# Patient Record
Sex: Female | Born: 1955 | Race: Black or African American | Hispanic: No | Marital: Married | Smoking: Never smoker
Health system: Southern US, Community
[De-identification: ages and names within clinical notes are randomized; demographics above are authoritative.]

## PROBLEM LIST (undated history)

## (undated) DIAGNOSIS — D219 Benign neoplasm of connective and other soft tissue, unspecified: Secondary | ICD-10-CM

## (undated) HISTORY — DX: Benign neoplasm of connective and other soft tissue, unspecified: D21.9

---

## 1987-01-21 HISTORY — PX: TUBAL LIGATION: SHX77

## 1999-08-07 ENCOUNTER — Other Ambulatory Visit: Admission: RE | Admit: 1999-08-07 | Discharge: 1999-08-07 | Payer: Self-pay | Admitting: Family Medicine

## 1999-08-27 ENCOUNTER — Other Ambulatory Visit: Admission: RE | Admit: 1999-08-27 | Discharge: 1999-08-27 | Payer: Self-pay | Admitting: Family Medicine

## 2000-09-23 ENCOUNTER — Other Ambulatory Visit: Admission: RE | Admit: 2000-09-23 | Discharge: 2000-09-23 | Payer: Self-pay | Admitting: *Deleted

## 2001-02-17 ENCOUNTER — Encounter: Payer: Self-pay | Admitting: Family Medicine

## 2001-02-17 ENCOUNTER — Ambulatory Visit (HOSPITAL_COMMUNITY): Admission: RE | Admit: 2001-02-17 | Discharge: 2001-02-17 | Payer: Self-pay | Admitting: Family Medicine

## 2001-03-25 ENCOUNTER — Ambulatory Visit (HOSPITAL_COMMUNITY): Admission: RE | Admit: 2001-03-25 | Discharge: 2001-03-25 | Payer: Self-pay | Admitting: *Deleted

## 2001-03-25 ENCOUNTER — Encounter (INDEPENDENT_AMBULATORY_CARE_PROVIDER_SITE_OTHER): Payer: Self-pay | Admitting: *Deleted

## 2001-07-09 ENCOUNTER — Encounter: Admission: RE | Admit: 2001-07-09 | Discharge: 2001-07-09 | Payer: Self-pay | Admitting: *Deleted

## 2001-07-09 ENCOUNTER — Encounter: Payer: Self-pay | Admitting: *Deleted

## 2002-07-20 ENCOUNTER — Encounter: Admission: RE | Admit: 2002-07-20 | Discharge: 2002-07-20 | Payer: Self-pay

## 2003-02-14 ENCOUNTER — Encounter: Admission: RE | Admit: 2003-02-14 | Discharge: 2003-02-14 | Payer: Self-pay | Admitting: Family Medicine

## 2003-02-24 ENCOUNTER — Ambulatory Visit (HOSPITAL_COMMUNITY): Admission: RE | Admit: 2003-02-24 | Discharge: 2003-02-24 | Payer: Self-pay | Admitting: Family Medicine

## 2003-04-04 ENCOUNTER — Other Ambulatory Visit: Admission: RE | Admit: 2003-04-04 | Discharge: 2003-04-04 | Payer: Self-pay | Admitting: Family Medicine

## 2003-07-21 ENCOUNTER — Encounter: Admission: RE | Admit: 2003-07-21 | Discharge: 2003-07-21 | Payer: Self-pay

## 2003-07-27 ENCOUNTER — Encounter: Admission: RE | Admit: 2003-07-27 | Discharge: 2003-07-27 | Payer: Self-pay

## 2004-04-15 ENCOUNTER — Other Ambulatory Visit: Admission: RE | Admit: 2004-04-15 | Discharge: 2004-04-15 | Payer: Self-pay | Admitting: Gynecology

## 2004-08-08 ENCOUNTER — Encounter: Admission: RE | Admit: 2004-08-08 | Discharge: 2004-08-08 | Payer: Self-pay | Admitting: Gynecology

## 2004-10-16 ENCOUNTER — Other Ambulatory Visit: Admission: RE | Admit: 2004-10-16 | Discharge: 2004-10-16 | Payer: Self-pay | Admitting: Gynecology

## 2005-07-30 ENCOUNTER — Other Ambulatory Visit: Admission: RE | Admit: 2005-07-30 | Discharge: 2005-07-30 | Payer: Self-pay | Admitting: Gynecology

## 2005-08-13 ENCOUNTER — Encounter: Admission: RE | Admit: 2005-08-13 | Discharge: 2005-08-13 | Payer: Self-pay | Admitting: Gynecology

## 2005-10-31 ENCOUNTER — Ambulatory Visit (HOSPITAL_BASED_OUTPATIENT_CLINIC_OR_DEPARTMENT_OTHER): Admission: RE | Admit: 2005-10-31 | Discharge: 2005-10-31 | Payer: Self-pay | Admitting: Orthopedic Surgery

## 2006-08-18 ENCOUNTER — Encounter: Admission: RE | Admit: 2006-08-18 | Discharge: 2006-08-18 | Payer: Self-pay | Admitting: Gynecology

## 2006-10-09 ENCOUNTER — Other Ambulatory Visit: Admission: RE | Admit: 2006-10-09 | Discharge: 2006-10-09 | Payer: Self-pay | Admitting: Gynecology

## 2006-10-19 ENCOUNTER — Inpatient Hospital Stay (HOSPITAL_COMMUNITY): Admission: RE | Admit: 2006-10-19 | Discharge: 2006-10-21 | Payer: Self-pay | Admitting: Gynecology

## 2006-10-19 ENCOUNTER — Encounter: Payer: Self-pay | Admitting: Gynecology

## 2006-10-19 HISTORY — PX: ABDOMINAL HYSTERECTOMY: SHX81

## 2007-08-23 ENCOUNTER — Encounter: Admission: RE | Admit: 2007-08-23 | Discharge: 2007-08-23 | Payer: Self-pay | Admitting: Gynecology

## 2007-10-13 ENCOUNTER — Ambulatory Visit: Payer: Self-pay | Admitting: Gynecology

## 2007-10-13 ENCOUNTER — Other Ambulatory Visit: Admission: RE | Admit: 2007-10-13 | Discharge: 2007-10-13 | Payer: Self-pay | Admitting: Gynecology

## 2007-10-13 ENCOUNTER — Encounter: Payer: Self-pay | Admitting: Gynecology

## 2007-10-20 ENCOUNTER — Ambulatory Visit: Payer: Self-pay | Admitting: Gynecology

## 2007-11-01 ENCOUNTER — Ambulatory Visit: Payer: Self-pay | Admitting: Gynecology

## 2008-08-23 ENCOUNTER — Encounter: Admission: RE | Admit: 2008-08-23 | Discharge: 2008-08-23 | Payer: Self-pay | Admitting: Gynecology

## 2008-10-17 ENCOUNTER — Encounter: Payer: Self-pay | Admitting: Gynecology

## 2008-10-17 ENCOUNTER — Other Ambulatory Visit: Admission: RE | Admit: 2008-10-17 | Discharge: 2008-10-17 | Payer: Self-pay | Admitting: Gynecology

## 2008-10-17 ENCOUNTER — Ambulatory Visit: Payer: Self-pay | Admitting: Gynecology

## 2008-10-20 ENCOUNTER — Ambulatory Visit: Payer: Self-pay | Admitting: Gynecology

## 2008-10-27 ENCOUNTER — Ambulatory Visit: Payer: Self-pay | Admitting: Gynecology

## 2009-04-24 ENCOUNTER — Ambulatory Visit: Payer: Self-pay | Admitting: Gynecology

## 2009-06-28 ENCOUNTER — Encounter: Admission: RE | Admit: 2009-06-28 | Discharge: 2009-06-28 | Payer: Self-pay | Admitting: Family Medicine

## 2009-08-27 ENCOUNTER — Encounter: Admission: RE | Admit: 2009-08-27 | Discharge: 2009-08-27 | Payer: Self-pay | Admitting: Gynecology

## 2009-08-30 ENCOUNTER — Encounter: Admission: RE | Admit: 2009-08-30 | Discharge: 2009-08-30 | Payer: Self-pay | Admitting: Gynecology

## 2009-10-18 ENCOUNTER — Other Ambulatory Visit: Admission: RE | Admit: 2009-10-18 | Discharge: 2009-10-18 | Payer: Self-pay | Admitting: Gynecology

## 2009-10-18 ENCOUNTER — Ambulatory Visit: Payer: Self-pay | Admitting: Gynecology

## 2009-12-20 ENCOUNTER — Ambulatory Visit: Payer: Self-pay | Admitting: Gynecology

## 2010-06-04 NOTE — H&P (Signed)
NAME:  Courtney Baxter, Courtney Baxter             ACCOUNT NO.:  [id number]   MEDICAL RECORD NO.:  [id number]          PATIENT TYPE:  AMB   LOCATION:  SDC                           FACILITY:  [facility]   PHYSICIAN:  Juan H. Lily Peer, M.D.DATE OF BIRTH:  Apr 08, 1955   DATE OF ADMISSION:  10/15/2006  DATE OF DISCHARGE:                              HISTORY & PHYSICAL   CHIEF COMPLAINT:  Symptomatic leiomyomata uteri.   HISTORY:  The patient is a 55 year old, gravida 2, para 2 who was seen  in the office for her annual gynecological examination as well as  preoperative consultation on September 19. The patient is scheduled to  undergo a total abdominal hysterectomy and bilateral salpingo-  oophorectomy on Monday, October 19, 2006 at 7:30 a.m. at [facility] secondary to symptomatic to leiomyomatous uteri and  dysfunctional uterine bleeding. We had attempted in the past to do  endometrial biopsy but due to the encroachment from these multiple  fibroids, it made it difficult to be able to pass the endometrial  catheter. Her ultrasound on May 29 demonstrated she had multiple  fibroids, one measuring 5.6 x 4.3 cm posterior to the uterus, another  one measured 4.7 x 4.5 cm and a third one to the right of the uterus  measuring 5.6 x 5.8 cm. Overall her urine size is 11.4 x 10 x 10.4 cm.  Endometrial stripe was 11 mm. The right ovary was not seen on the  ultrasound, the left ovary was normal. The patient also that day at her  preoperative consultation she was having a vaginal discharge. A wet prep  had been done which demonstrated evidence of bacterial vaginosis and  moniliasis. She was placed on Flagyl 500 mg b.i.d. for a 5-day course as  well as given a prescription for Diflucan 150 mg p.o.   PAST MEDICAL HISTORY:  She denies any allergies, she has had previous  laparoscopy, tubal ligation in 1982, two normal spontaneous vaginal  deliveries. She is currently on no medication. No past medical history.  No  prior other surgeries.   FAMILY HISTORY:  Negative family history.   PHYSICAL EXAMINATION:  The patient weighs 154 pounds, height 5 feet 5  inches tall.  HEENT:  Unremarkable.  NECK:  Supple, trachea midline. No carotid bruits, no thyromegaly.  LUNGS:  Clear to auscultation without rhonchi or wheezes.  HEART:  Regular rate and rhythm. No murmurs or gallops.  BREASTS:  Unremarkable.  ABDOMEN:  Soft, nontender, no rebound, or guarding.  PELVIC:  Bartholin, urethra and Skene's gland within normal limits.  Vagina and cervix no gross lesions on inspection. Bimanual examination  demonstrated an irregular uterus measuring 12-14 weeks size. It makes it  difficult to evaluate secondary to the size of the uterus.  RECTAL:  Deferred.   ASSESSMENT:  A 55 year old, gravida 2, para 2 with dysfunctional uterine  bleeding and symptomatic leiomyomatous uteri scheduled for total  abdominal hysterectomy and bilateral salpingo-oophorectomy Monday,  September 29 at 7:30 a.m. at [facility]. The risks, benefits and  pros and cons of the operation were discussed with the patient to  include infection  although she will receive prophylaxis antibiotics, the  risks for deep venous thrombosis and subsequent pulmonary embolism. For  this reason, PSA stockings will be placed. Also in the event of  uncontrollable hemorrhage if she were to receive blood or blood products  she is fully aware of the potential risk for anaphylactic reaction,  hepatitis and AIDS. Finally the potential risk for trauma to internal  organs such as the bladder, intestines and internal structures requiring  corrective surgery. The patient is also fully aware that when she has a  bilateral hysterectomy and bilateral oophorectomy she may need to be  placed on hormone replacement therapy for a short time to help with the  vasomotor symptoms. Women's Health Initiative study had been discussed  as well. All questions are answered and will  follow accordingly. Of  note, her preop lab demonstrated a urinalysis of 25,000 pure colony  count and we will call in a prescription for Cipro 250 mg b.i.d. to take  for 3 days.      Juan H. Lily Peer, M.D.  Electronically Signed     JHF/MEDQ  D:  10/15/2006  T:  10/15/2006  Job:  [id number]

## 2010-06-04 NOTE — Op Note (Signed)
NAME:  Courtney Baxter, Courtney Baxter             ACCOUNT NO.:  [id number]   MEDICAL RECORD NO.:  [id number]          PATIENT TYPE:  AMB   LOCATION:  SDC                           FACILITY:  [facility]   PHYSICIAN:  Juan H. Lily Peer, M.D.DATE OF BIRTH:  1955/07/15   DATE OF PROCEDURE:  10/19/2006  DATE OF DISCHARGE:                               OPERATIVE REPORT   SURGEON:  Juan H. Lily Peer, M.D.   FIRST ASSISTANT:  Daniel L. Eda Paschal, M.D.   INDICATIONS FOR OPERATION:  A 55 year old gravida 2, para 2 with  symptomatic leiomyomatous uteri complaining of dysmenorrhea,  menorrhagia, and dysfunctional uterine bleeding.   PREOPERATIVE DIAGNOSES:  1. Leiomyomatous uteri.  2. Pelvic pain.  3. Dysfunctional uterine bleeding.   POSTOPERATIVE DIAGNOSES:  1. Leiomyomatous uteri.  2. Pelvic pain.  3. Dysfunctional uterine bleeding.   ANESTHESIA:  General endotracheal anesthesia.   PROCEDURE PERFORMED:  Total abdominal hysterectomy with bilateral  salpingo-oophorectomy.   FINDINGS:  The patient with a 12-week size leiomyomatous uteri with  multiple subserosal or intramural leiomyomas, normal-appearing atretic  ovaries, and evidence of previous tubal ligation and pelvic adhesions.   DESCRIPTION OF OPERATION:  After the patient was adequately counseled,  she was taken to the operating room where she underwent successful  general endotracheal anesthesia.  She received a gram of cefoxitin for  prophylaxis.  A Foley catheter had been inserted for monitorization of  urine output and PAS stockings were utilized for DVT prophylaxis.  Once  they were following this, the abdomen was prepped and draped in the  usual sterile fashion.  A Pfannenstiel skin incision was made 2 cm above  the symphysis pubis.  Incision was carried out through skin,  subcutaneous tissue down to the rectus fascia where a midline nick was  made.  The fascia was incised in transverse fashion.  The peritoneal  cavity was entered  cautiously.  The patient was then placed in  Trendelenburg position.  The O'Connor-O'Sullivan retractors were placed.  At this point, it was noted she had multiple leiomyomas encroaching into  the cul-de-sac and at the first area that was identified with the right  round ligament, which was suture ligated with 0 Vicryl suture and  transected and anterior bladder flap was established.  The right ureter  was identified.  The surgeon's finger penetrated posterior broad  ligament.  The right infundibulopelvic ligament was clamped, cut, free  tied with 0 Vicryl suture followed by transfixion stitch.  Similar  procedure was carried out on the contralateral side.  The parametrial  tissue was dissected and the cardinal and broad ligaments were serially  clipped, serially clamped, cut, and suture ligated with 0 Vicryl suture.  Due to the size of the fibroid uterus and for exposure, enucleation of  several leiomyomas was required in order to have adequate exposure.  Once the uterine arteries had been secured, clamped, cut, and suture  ligated with 0 Vicryl suture, supracervical hysterectomy was performed  by removing the upper portion of the uterus tube and ovary and passed  off the operative field.  The remaining cervical stump was identified.  The bladder  was peeled off the lower uterine segment and the right and  left fornix was entered and with the use of Jorgenson scissors, the  cervix was excised off the vagina.  Both angles were secured with a  transfixion stitch of 0 Vicryl suture and the remaining cuff was secured  with interrupted suture of 0 Vicryl suture.  The pelvic cavity was  copiously irrigated with normal saline solution.  Surgicel was used for  additional hemostasis from some of the raw surfaces.  Sponge count and  needle count were correct after the pelvic cavity had been copiously  irrigated with normal saline solution.  The visceral peritoneum was  closed with a running stitch  of 3-0 Vicryl suture and the fascia was  reapproximated with running stitch of 0 Vicryl suture.   For postoperative pain, an ON-Q catheter was placed between the  peritoneum and the fascia.  The fascia had been secured with 0 Vicryl  suture and the skin was reapproximated with a running subcuticular  stitch of 3-0 Vicryl suture and incision was Steri-Stripped.  A bolus of  1% lidocaine 10 mL was instilled through the ON-Q catheter and dressings  were placed.  The patient was extubated and transferred to recovery room  with stable vital signs.   BLOOD LOSS FROM THE PROCEDURE:  550 mL.   IV FLUIDS:  Consisted of 3300 mL of lactated Ringer.   URINE OUTPUT:  200 mL and clear.   SPECIMEN WEIGHT:  587.4 g.      Juan H. Lily Peer, M.D.  Electronically Signed     JHF/MEDQ  D:  10/19/2006  T:  10/19/2006  Job:  [id number]

## 2010-06-07 NOTE — Op Note (Signed)
NAME:  Courtney Baxter, Courtney Baxter             ACCOUNT NO.:  [id number]   MEDICAL RECORD NO.:  [id number]          PATIENT TYPE:  AMB   LOCATION:  DSC                          FACILITY:  [facility]   PHYSICIAN:  Harvie Junior, M.D.   DATE OF BIRTH:  05-Jan-1956   DATE OF PROCEDURE:  10/31/2005  DATE OF DISCHARGE:                                 OPERATIVE REPORT   PREOPERATIVE DIAGNOSIS:  Rupture of the ulnar collateral ligament to the  thumb.   POSTOPERATIVE DIAGNOSIS:  Rupture of the ulnar collateral ligament to the  thumb.   PRINCIPLE PROCEDURE:  Open repair of ulnar collateral ligament to the right  thumb.   SURGEON:  Harvie Junior, M.D.   ASSISTANT:  Marshia Ly, P.A.   ANESTHESIA:  General.   BRIEF HISTORY:  Courtney Baxter is a 55 year old female with a long history of  having fallen and sort of sprained her thumb.  She was evaluated and noted  at that time to have really complete rupture of the ulnar collateral  ligament.  There was no tendency towards holding the thumb still when you  tried to adduct it, and it was 60 degrees greater than the opposite side.  We talked to her about treatment options, and it was ultimately felt that  with that degree of instability, she would be likely to go on to have  trouble longterm, and we felt that repair of this was appropriate.  She was  brought to the operating room for this procedure.   PROCEDURE:  The patient was brought to the operating room, and after an  adequate anesthesia was obtained with a general anesthetic, the patient was  placed supine on the operating table.  The right arm was then prepped and  draped in the usual sterile fashion.  Following this, a curved incision was  made from just over the ulnar collateral ligament, curving dorsally, and  starting at the midaxial line of the thumb on the ulnar side.  The  subcutaneous tissues were dissected to the level of the adductor  aponeurosis, which was divided and tagged and  identified, which gave access  to the joint capsule, just ulnar to the extensor tendon.  The joint capsule  was opened.  Once this was accomplished, you could see the torn ulnar  collateral ligament.  Interestingly, it was attached fine on the proximal  phalanx, but evulsed from the metacarpal side.  The ligament was then freed  from the overlying capsule, and when that was achieved, the ligament could  be held in place, and this stabilized the thumb quite nicely.  A mini-  Arthrex anchor was then used, and the area of the bone was roughened and the  ligament was sewn right down to the central portion of the metacarpal head.  This gave excellent repair of the ulnar collateral ligament and essentially  eliminated the abduction of the thumb past the normal limit.  At that point,  a capsular repair was undertaken with some 4-0 Tycron interrupted suture.  Once the capsular repair was completed, the adductor aponeurosis was  advanced and repaired in  a running fashion to where it came from.  Once that  was completed, a final check was made of the stability.  Excellent stability  was achieved at this point.  The patient was then splinted in slight  abduction, and after  the skin was closed with nylon interrupted sutures.  Once the splint was  applied, after a sterile compressive dressing was applied, then the patient  was taken to the recovery room, where she was noted to be in satisfactory  condition.  The estimated blood loss for the procedure was none.      Harvie Junior, M.D.  Electronically Signed     JLG/MEDQ  D:  10/31/2005  T:  10/31/2005  Job:  [id number]

## 2010-06-07 NOTE — Discharge Summary (Signed)
NAME:  Courtney Baxter, Courtney Baxter             ACCOUNT NO.:  [id number]   MEDICAL RECORD NO.:  [id number]          PATIENT TYPE:  INP   LOCATION:  9317                          FACILITY:  [facility]   PHYSICIAN:  Juan H. Lily Peer, M.D.DATE OF BIRTH:  [birth year]   DATE OF ADMISSION:  10/19/2006  DATE OF DISCHARGE:  10/21/2006                               DISCHARGE SUMMARY   Total days hospitalized were 2.   HISTORY:  The patient is a 55 year old gravida 2, para 2, who was taken  to the operating room on the morning of October 19, 2006, where she  underwent a transabdominal hysterectomy with bilateral salpingo-  oophorectomy secondary to symptomatic leiomyomatous uteri and  dysfunctional uterine bleeding.  The patient did well intraoperatively.  She had PSA stockings for DVT prophylaxis and received cefoxitin 1 g for  prophylaxis as well and had an EBL of 550 mL during her surgery.  Postoperatively, on postoperative day #1, her maximum temperature was  98.  Her O2 sats were 99% on room air.  Her hemoglobin and hematocrit  were 8.6 and 24.9 respectively with platelet count of 284,000.  Her PCA  pump and Foley catheter were discontinued that day.  She was started on  clear liquid diet.  She was starting to ambulate.  The patient had  orthostatic blood pressures done that day and systolics in the upper  120s to 130s and diastolics in the high 70s and low 80s with stable  pulse between 67 and 80.  The patient was just sluggish getting out of  bed, but once she got up and started ambulating, she was not  symptomatic.  On her second postoperative day, she was ambulating, had  passed flatus, and tolerated a regular diet.  Her incision site was  intact, and she was ready to be discharged home.   PROCEDURES PERFORMED:  Total abdominal hysterectomy and bilateral  salpingo-oophorectomy.   DIAGNOSES:  1. Dysmenorrhea.  2. Menorrhagia.  3. Fibroids.  4. Anemia.   FINAL DISPOSITION AND FOLLOWUP:  The  patient was discharged home on her  second postoperative day.  Her pathology report demonstrated cervix with  mild chronic inflammation, endometrium with secretory endometrium,  myometrium with leiomyoma, serosa unremarkable, right and left ovaries  and tubes were otherwise unremarkable except for paratubal cyst.  The  patient was given a prescription for Lortab 7.5/500 mg to take 1 p.o. q.  4-6 h. p.r.n. pain, Motrin 800 mg t.i.d. p.r.n., Reglan 10 mg 1 p.o. q.  4-6 h. p.r.n. nausea or vomiting.  The patient was started on Estratest  0.65 mg to take 1 p.o. daily, and she is to follow up in the office on  October 27, 2006, and she was instructed to take 1 iron tablet daily.      Juan H. Lily Peer, M.D.  Electronically Signed     JHF/MEDQ  D:  11/12/2006  T:  11/12/2006  Job:  [id number]

## 2010-08-07 ENCOUNTER — Other Ambulatory Visit: Payer: Self-pay | Admitting: Gynecology

## 2010-08-07 DIAGNOSIS — Z1231 Encounter for screening mammogram for malignant neoplasm of breast: Secondary | ICD-10-CM

## 2010-09-02 ENCOUNTER — Ambulatory Visit
Admission: RE | Admit: 2010-09-02 | Discharge: 2010-09-02 | Disposition: A | Discharge status: Home / Self Care | Payer: 59 | Source: Ambulatory Visit | Attending: Gynecology | Admitting: Gynecology

## 2010-09-02 DIAGNOSIS — Z1231 Encounter for screening mammogram for malignant neoplasm of breast: Secondary | ICD-10-CM

## 2010-10-16 ENCOUNTER — Encounter: Payer: Self-pay | Admitting: Anesthesiology

## 2010-10-21 ENCOUNTER — Encounter: Payer: Self-pay | Admitting: Gynecology

## 2010-10-21 ENCOUNTER — Ambulatory Visit (INDEPENDENT_AMBULATORY_CARE_PROVIDER_SITE_OTHER): Payer: 59 | Admitting: Gynecology

## 2010-10-21 ENCOUNTER — Other Ambulatory Visit (HOSPITAL_COMMUNITY)
Admission: RE | Admit: 2010-10-21 | Discharge: 2010-10-21 | Disposition: A | Discharge status: Home / Self Care | Payer: 59 | Source: Ambulatory Visit | Attending: Gynecology | Admitting: Gynecology

## 2010-10-21 VITALS — BP 130/88 | Ht 66.5 in | Wt 161.0 lb

## 2010-10-21 DIAGNOSIS — Z13220 Encounter for screening for lipoid disorders: Secondary | ICD-10-CM

## 2010-10-21 DIAGNOSIS — K5904 Chronic idiopathic constipation: Secondary | ICD-10-CM

## 2010-10-21 DIAGNOSIS — K5909 Other constipation: Secondary | ICD-10-CM

## 2010-10-21 DIAGNOSIS — Z1211 Encounter for screening for malignant neoplasm of colon: Secondary | ICD-10-CM

## 2010-10-21 DIAGNOSIS — Z01419 Encounter for gynecological examination (general) (routine) without abnormal findings: Secondary | ICD-10-CM | POA: Insufficient documentation

## 2010-10-21 DIAGNOSIS — Z131 Encounter for screening for diabetes mellitus: Secondary | ICD-10-CM

## 2010-10-21 DIAGNOSIS — K921 Melena: Secondary | ICD-10-CM

## 2010-10-21 DIAGNOSIS — R635 Abnormal weight gain: Secondary | ICD-10-CM

## 2010-10-21 DIAGNOSIS — R823 Hemoglobinuria: Secondary | ICD-10-CM

## 2010-10-21 DIAGNOSIS — R29818 Other symptoms and signs involving the nervous system: Secondary | ICD-10-CM

## 2010-10-21 NOTE — Progress Notes (Signed)
Courtney Baxter [birth year] [id number]   History:    55 y.o.  for annual exam with a presentation that over the course of the past 6 months she has been experiencing left lower extremity weakness especially from the knee down and also her left arm which she is laying down flat in bed. She is an Designer, industrial/product type of work. She denies any loss of memory. At her weakness is cost her difficulty of getting up and sometimes even walking as well. She also stated that at times when she wipes after having a bowel movement especially after straining she will notice some blood. She had a colonoscopy in 2009 with reported only external hemorrhoid. Her mammogram was in August of this year which was normal she does her monthly self breast examination. Her primary physician is Dr. Laurann Montana. No recent lab work has been done.  Past medical history,surgical history, family history and social history were all reviewed and documented in the EPIC chart.  ROS:  Was performed and pertinent positives and negatives are included in the history.  Exam: chaperone present BP 130/88  Ht 5' 6.5" (1.689 m)  Wt 161 lb (73.029 kg)  BMI 25.60 kg/m2  Body mass index is 25.60 kg/(m^2).  General appearance : Well developed well nourished female. No acute distress HEENT: Neck supple, trachea midline, no carotid bruits, no thyroidmegaly Lungs: Clear to auscultation, no rhonchi or wheezes, or rib retractions  Heart: Regular rate and rhythm, no murmurs or gallops Breast:Examined in sitting and supine position were symmetrical in appearance, no palpable masses or tenderness,  no skin retraction, no nipple inversion, no nipple discharge, no skin discoloration, no axillary or supraclavicular lymphadenopathy Abdomen: no palpable masses or tenderness, no rebound or guarding Extremities: no edema or skin discoloration or tenderness  Pelvic:  Bartholin, Urethra, Skene Glands: Within normal limits             Vagina: No gross  lesions or discharge  Cervix: Absent  Uterus  absent,   Adnexa  Without masses or tenderness  Anus and perineum  normal   Rectovaginal  normal sphincter tone without palpated masses or tenderness, anoscopic exam small external hemorrhoid.             Hemoccult not done     Assessment/Plan:  55 y.o. female for annual exam with small external hemorrhoid. During examination it appears that she has sensory deficit of the left lower extremity especially the medial aspect which she had no sensitivity one stroke with Q-tip. Deep tendon reflexes on both knees were normal otherwise. She will be referred to a neurologist for further evaluation to rule out the possibility of MS. She has had weight gain instead of weight loss of about 7 pounds last year. The following labs were drawn today CBC TSH random blood sugar screening cholesterol urinalysis and Pap smear. She was instructed to take calcium 1200 mg daily and vitamin D 2000 units daily. Will await the recommendation from the neurologist.    Ok Edwards MD, 4:02 PM 10/21/2010

## 2010-10-21 NOTE — Patient Instructions (Signed)
Courtney Baxter we will call you later this week after we make the appointment with the neurologist. Remember to take a calcium and vitamin D as I had instructed to.

## 2010-10-23 ENCOUNTER — Ambulatory Visit (INDEPENDENT_AMBULATORY_CARE_PROVIDER_SITE_OTHER): Payer: 59 | Admitting: *Deleted

## 2010-10-23 ENCOUNTER — Telehealth: Payer: Self-pay | Admitting: *Deleted

## 2010-10-23 DIAGNOSIS — E7800 Pure hypercholesterolemia, unspecified: Secondary | ICD-10-CM

## 2010-10-23 DIAGNOSIS — R8271 Bacteriuria: Secondary | ICD-10-CM

## 2010-10-23 DIAGNOSIS — R823 Hemoglobinuria: Secondary | ICD-10-CM

## 2010-10-23 NOTE — Telephone Encounter (Signed)
PT INFORMED WITH THE BELOW NOTE, ORDERS IN COMPUTER.

## 2010-10-23 NOTE — Telephone Encounter (Signed)
Message copied by Aura Camps on Wed Oct 23, 2010  9:44 AM ------      Message from: Ok Edwards      Created: Tue Oct 22, 2010  2:34 PM       Patient was seen in the office yesterday her total cholesterol was 222 We willnotify her to return to the office in a fasting state for fasting lipid profile. Her urinalysis did demonstrate 2+ bacteria and a culture demonstrated 25,000 pure colony count,  she was not having any dysuria or frequency we'll have her recheck her urinalysis when she comes in for her fasting lipid profile.

## 2010-10-31 LAB — URINE MICROSCOPIC-ADD ON

## 2010-10-31 LAB — URINALYSIS, ROUTINE W REFLEX MICROSCOPIC
Glucose, UA: NEGATIVE
Ketones, ur: NEGATIVE
Nitrite: NEGATIVE
Specific Gravity, Urine: 1.025

## 2010-10-31 LAB — CBC
Hemoglobin: 12.8
MCHC: 34
MCV: 90.1
Platelets: 389
RBC: 2.78 — ABNORMAL LOW
RBC: 4.19
RDW: 13.7
WBC: 10.2
WBC: 7.6

## 2010-10-31 LAB — HEMOGLOBIN AND HEMATOCRIT, BLOOD
HCT: 27.3 — ABNORMAL LOW
Hemoglobin: 9.4 — ABNORMAL LOW

## 2010-11-07 ENCOUNTER — Other Ambulatory Visit: Payer: Self-pay | Admitting: Family Medicine

## 2010-11-07 DIAGNOSIS — IMO0001 Reserved for inherently not codable concepts without codable children: Secondary | ICD-10-CM

## 2010-11-09 ENCOUNTER — Ambulatory Visit
Admission: RE | Admit: 2010-11-09 | Discharge: 2010-11-09 | Disposition: A | Discharge status: Home / Self Care | Payer: 59 | Source: Ambulatory Visit | Attending: Family Medicine | Admitting: Family Medicine

## 2010-11-09 DIAGNOSIS — IMO0001 Reserved for inherently not codable concepts without codable children: Secondary | ICD-10-CM

## 2010-11-12 ENCOUNTER — Telehealth: Payer: Self-pay | Admitting: *Deleted

## 2010-11-12 NOTE — Telephone Encounter (Signed)
Had faxed records to Dr. Oliva Bustard office since 10/23/10.  They have been trying to reach patient all this time, and I have checked with their office and no appointment had been made.  So I called patient today and she said that she decided to go to her PCP Dr. Katrinka Blazing, and she ordered an MRI (done on 11/10/10 results in imaging tab).  She decided she will not go to neurologist because insurance would cover better with PCP.  Dr. Katrinka Blazing will be taking over the care for this issue.  Just to let you know.

## 2010-11-12 NOTE — Telephone Encounter (Signed)
Message copied by Libby Maw on Tue Nov 12, 2010  9:47 AM ------      Message from: Ok Edwards      Created: Mon Oct 21, 2010  4:07 PM       Kena Limon, please schedule appointment for this patient with Dr. Porfirio Mylar Dohmeier or Dr Noel Christmas Neurologist. Working diagnosis MS. You can copy and paste my office note to fax to them if they are not on Epic. Thanks

## 2011-03-31 ENCOUNTER — Telehealth: Payer: Self-pay | Admitting: *Deleted

## 2011-03-31 NOTE — Telephone Encounter (Signed)
Pt called requesting that her office note from last OV faxed to dr. Elton Sin office, notes faxed.

## 2011-04-03 ENCOUNTER — Telehealth: Payer: Self-pay | Admitting: *Deleted

## 2011-04-03 NOTE — Telephone Encounter (Signed)
Patient wanted to know if we could get appt with Neurologist moved up from Monday.  Told patient there would be no way to move up patient cause she had spoke directly to their office and was able to atleast get an appt for Monday.  Patient c/o pain, told patient to take OTC pain reliever till seen with Neurologist.

## 2011-04-29 ENCOUNTER — Other Ambulatory Visit: Payer: Self-pay | Admitting: *Deleted

## 2011-04-29 DIAGNOSIS — E7800 Pure hypercholesterolemia, unspecified: Secondary | ICD-10-CM

## 2011-07-29 ENCOUNTER — Other Ambulatory Visit: Payer: Self-pay | Admitting: Gynecology

## 2011-07-29 DIAGNOSIS — Z1231 Encounter for screening mammogram for malignant neoplasm of breast: Secondary | ICD-10-CM

## 2011-09-03 ENCOUNTER — Ambulatory Visit: Payer: 59

## 2011-09-03 ENCOUNTER — Ambulatory Visit
Admission: RE | Admit: 2011-09-03 | Discharge: 2011-09-03 | Disposition: A | Discharge status: Home / Self Care | Payer: 59 | Source: Ambulatory Visit | Attending: Gynecology | Admitting: Gynecology

## 2011-09-03 DIAGNOSIS — Z1231 Encounter for screening mammogram for malignant neoplasm of breast: Secondary | ICD-10-CM

## 2011-10-22 ENCOUNTER — Encounter: Payer: Self-pay | Admitting: Gynecology

## 2011-10-22 ENCOUNTER — Ambulatory Visit (INDEPENDENT_AMBULATORY_CARE_PROVIDER_SITE_OTHER): Payer: 59 | Admitting: Gynecology

## 2011-10-22 VITALS — BP 122/80 | Ht 66.5 in | Wt 158.0 lb

## 2011-10-22 DIAGNOSIS — Z01419 Encounter for gynecological examination (general) (routine) without abnormal findings: Secondary | ICD-10-CM

## 2011-10-22 DIAGNOSIS — Z23 Encounter for immunization: Secondary | ICD-10-CM

## 2011-10-22 DIAGNOSIS — N952 Postmenopausal atrophic vaginitis: Secondary | ICD-10-CM

## 2011-10-22 MED ORDER — ESTRADIOL 10 MCG VA TABS: 1.0000 | ORAL_TABLET | VAGINAL | Status: DC

## 2011-10-22 NOTE — Patient Instructions (Addendum)
Diphtheria, Tetanus, and Pertussis (DTaP) Vaccine What You Need to Know WHY GET VACCINATED? Diphtheria, tetanus, and pertussis are serious diseases caused by bacteria. Diphtheria and pertussis are spread from person to person. Tetanus enters the body through cuts or wounds. Diphtheria causes a thick covering in the back of the throat.  It can lead to breathing problems, paralysis, heart failure, and even death. Tetanus (Lockjaw) causes painful tightening of the muscles, usually all over the body.  It can lead to "locking" of the jaw so the victim cannot open his or her mouth or swallow. Tetanus leads to death in about 2 out of 10 cases. Pertussis (Whooping Cough) causes coughing spells so bad that it is hard for infants to eat, drink, or breathe. These spells can last for weeks.  It can lead to pneumonia, seizures (jerking and staring spells), brain damage, and death. Diphtheria, tetanus, and pertussis vaccine (DTaP) can help prevent these diseases. Most children who are vaccinated with DTaP will be protected throughout childhood. Many more children would get these diseases if we stopped vaccinating. DTaP is a safer version of an older vaccine called DTP. DTP is no longer used in the [place]. WHO SHOULD GET DTAP VACCINE AND WHEN? Children should get 5 doses of DTaP vaccine, 1 dose at each of the following ages:  2 months.  4 months.  6 months.  15 to 18 months.  4 to 6 years. DTaP may be given at the same time as other vaccines. SOME CHILDREN SHOULD NOT GET DTAP VACCINE OR SHOULD WAIT  Children with minor illnesses, such as a cold, may be vaccinated. But children who are moderately or severely ill should usually wait until they recover before getting DTaP vaccine.  Any child who had a life-threatening allergic reaction after a dose of DTaP should not get another dose.  Any child who suffered a brain or nervous system disease within 7 days after a dose of DTaP should not get  another dose.  Talk with your caregiver if your child:  Had a seizure or collapsed after a dose of DTaP.  Cried non-stop for 3 hours or more after a dose of DTaP.  Had a fever over 105 F (40.6 C) after a dose of DTaP.  Ask your caregiver for more information. Some of these children should not get another dose of pertussis vaccine, but may get a vaccine without pertussis, called DT. OLDER CHILDREN AND ADULTS  DTaP is not licensed for adolescents, adults, or children 7 years of age and older.  But older people still need protection. A vaccine called Tdap is similar to DTaP. A single dose of Tdap is recommended for people 11 through 55 years of age. Another vaccine, called Td, protects against tetanus and diphtheria, but not pertussis. It is recommended every 10 years. WHAT ARE THE RISKS FROM DTAP VACCINE?  Getting diphtheria, tetanus, or pertussis disease is much riskier than getting DTaP vaccine.  However, a vaccine, like any medicine, is capable of causing serious problems, such as severe allergic reactions. The risk of DTaP vaccine causing serious harm, or death, is extremely small. Mild Problems (Common)  Fever (up to about 1 child in 4).  Redness or swelling where the shot was given (up to about 1 child in 4).  Soreness or tenderness where the shot was given (up to about 1 child in 4). These problems occur more often after the 4th and 5th doses of the DTaP series than after earlier doses. Sometimes the 4th   or 5th dose of DTaP vaccine is followed by swelling of the entire arm or leg in which the shot was given, lasting 1 to 7 days (up to about 1 child in 30). Other mild problems include:  Fussiness (up to about 1 child in 3).  Tiredness or poor appetite (up to about 1 child in 10).  Vomiting (up to about 1 child in 50). These problems generally occur 1 to 3 days after the shot. Moderate Problems (Uncommon)  Seizure (jerking or staring) (about 1 child out of  14,000).  Non-stop crying, for 3 hours or more (up to about 1 child out of 1,000).  High fever, over 105 F (40.6 C) (about 1 child out of 16,000). Severe Problems (Very Rare)  Serious allergic reaction (less than 1 out of a million doses).  Several other severe problems have been reported after DTaP vaccine. These include:  Long-term seizures, coma, or lowered consciousness.  Permanent brain damage. These are so rare it is hard to tell if they are caused by the vaccine. Controlling fever is especially important for children who have had seizures, for any reason. It is also important if another family member has had seizures. You can reduce fever and pain by giving your child an aspirin-free pain reliever when the shot is given, and for the next 24 hours, following the package instructions. WHAT IF THERE IS A MODERATE OR SEVERE REACTION? What should I look for? Any unusual conditions, such as a serious allergic reaction, high fever, or unusual behavior. Serious allergic reactions are extremely rare with any vaccine. If one were to occur, it would most likely be within a few minutes to a few hours after the shot. Signs can include difficulty breathing, hoarseness or wheezing, hives, paleness, weakness, a fast heartbeat, or dizziness. If a high fever or seizure were to occur, it would usually be within a week after the shot. What should I do?  Call your caregiver or get the person to a caregiver right away.  Tell the caregiver what happened, the date and time it happened, and when the vaccination was given.  Ask the caregiver, nurse, or health department to file a Vaccine Adverse Event Reporting System (VAERS) form. Or, you can file this report through the VAERS website at www.vaers.hhs.gov or by calling 1-800-822-7967. VAERS does not provide medical advice. THE NATIONAL VACCINE INJURY COMPENSATION PROGRAM  In the rare event that you or your child has a serious reaction to a vaccine, a  federal program has been created to help you pay for the care of those who have been harmed.  For details about the National Vaccine Injury Compensation Program, call 1-800-338-2382 or visit the program's website at www.hrsa.gov/vaccinecompensation HOW CAN I LEARN MORE?  Ask your caregiver. They can give you the vaccine package insert or suggest other sources of information.  Call your local or state health department's immunization program.  Contact the Centers for Disease Control and Prevention (CDC):  Call 1-800-232-4636 (1-800-CDC-INFO).  Visit the National Immunization Program's website at www.cdc.gov/vaccines CDC Diphtheria, Tetanus, and Pertussis (DTaP) Vaccine VIS (06/05/05) Document Released: 11/03/2005 Document Revised: 03/31/2011 Document Reviewed: 11/03/2005 [employer] Patient Information 2013 [employer].  

## 2011-10-22 NOTE — Progress Notes (Addendum)
Courtney Baxter Jan 12, 1956 [id number]   History:    56 y.o.  for annual gyn exam had no complaints today. Patient with past history of total abdominal hysterectomy and bilateral salpingo-oophorectomy. Her primary physician is Dr. Laurann Montana who has done her labs recently. Review of patient's records indicated she had a normal colonoscopy in 2009. Her last bone density study was in 2011 which was normal. She is taking calcium and vitamin D for osteoporosis prevention. She does suffer from vaginal atrophy but ran out of the Estrace vaginal cream and did not call in for a prescription. Patient with no past history of abnormal Pap smears. Patient with prior tubal sterilization procedure.  Past medical history,surgical history, family history and social history were all reviewed and documented in the EPIC chart.  Gynecologic History No LMP recorded. Patient has had a hysterectomy. Contraception: Total abdominal hysterectomy with bilateral salpingo-oophorectomy Last Pap: 2012. Results were: normal Last mammogram: 2013. Results were: normal  Obstetric History OB History    Grav Para Term Preterm Abortions TAB SAB Ect Mult Living   2 2        2      # Outc Date GA Lbr Len/2nd Wgt Sex Del Anes PTL Lv   1 PAR     F SVD      2 PAR     F SVD          ROS: A ROS was performed and pertinent positives and negatives are included in the history.  GENERAL: No fevers or chills. HEENT: No change in vision, no earache, sore throat or sinus congestion. NECK: No pain or stiffness. CARDIOVASCULAR: No chest pain or pressure. No palpitations. PULMONARY: No shortness of breath, cough or wheeze. GASTROINTESTINAL: No abdominal pain, nausea, vomiting or diarrhea, melena or bright red blood per rectum. GENITOURINARY: No urinary frequency, urgency, hesitancy or dysuria. MUSCULOSKELETAL: No joint or muscle pain, no back pain, no recent trauma. DERMATOLOGIC: No rash, no itching, no lesions. ENDOCRINE: No polyuria,  polydipsia, no heat or cold intolerance. No recent change in weight. HEMATOLOGICAL: No anemia or easy bruising or bleeding. NEUROLOGIC: No headache, seizures, numbness, tingling or weakness. PSYCHIATRIC: No depression, no loss of interest in normal activity or change in sleep pattern.     Exam: chaperone present  BP 122/80  Ht 5' 6.5" (1.689 m)  Wt 158 lb (71.668 kg)  BMI 25.12 kg/m2  Body mass index is 25.12 kg/(m^2).  General appearance : Well developed well nourished female. No acute distress HEENT: Neck supple, trachea midline, no carotid bruits, no thyroidmegaly Lungs: Clear to auscultation, no rhonchi or wheezes, or rib retractions  Heart: Regular rate and rhythm, no murmurs or gallops Breast:Examined in sitting and supine position were symmetrical in appearance, no palpable masses or tenderness,  no skin retraction, no nipple inversion, no nipple discharge, no skin discoloration, no axillary or supraclavicular lymphadenopathy Abdomen: no palpable masses or tenderness, no rebound or guarding Extremities: no edema or skin discoloration or tenderness  Pelvic:  Bartholin, Urethra, Skene Glands: Within normal limits             Vagina: No gross lesions or discharge, atrophy  Cervix: Absent Uterus absent  Adnexa  Without masses or tenderness  Anus and perineum  normal   Rectovaginal  normal sphincter tone without palpated masses or tenderness             Hemoccult cards provided for patient to submit for testing     Assessment/Plan:  56 y.o.  female for annual exam with evidence of vaginal atrophy. We discussed offering her a different treatment regimen such as with Vagifem 10 mcg to apply intravaginally twice a week. We did discuss once again the women's health initiative study the risks benefits and pros and cons of hormone replacement therapy. Patient would like to try the Vagifem. Samples were provided as well as prescription. She was encouraged to do her monthly self breast  examination. She was reminded also to submit to the office Hemoccult cards for testing. She is due for her bone density study in December this year. We discussed importance of calcium and vitamin D and regular exercise for osteoporosis prevention. Patient did receive her Tdap vaccine today. New Pap smear screening guidelines discussed. Patient would know prior history of abnormal Pap smears and now with history of hysterectomy will no longer need Pap smears.   Ok Edwards MD, 5:34 PM 10/22/2011

## 2011-10-29 ENCOUNTER — Encounter: Payer: Self-pay | Admitting: Gynecology

## 2011-10-30 ENCOUNTER — Other Ambulatory Visit: Payer: Self-pay | Admitting: Anesthesiology

## 2011-10-30 ENCOUNTER — Other Ambulatory Visit: Payer: Self-pay | Admitting: Gynecology

## 2011-10-30 DIAGNOSIS — K921 Melena: Secondary | ICD-10-CM

## 2011-10-30 DIAGNOSIS — Z1211 Encounter for screening for malignant neoplasm of colon: Secondary | ICD-10-CM

## 2012-07-20 ENCOUNTER — Other Ambulatory Visit: Payer: Self-pay | Admitting: Podiatry

## 2012-08-10 ENCOUNTER — Other Ambulatory Visit: Payer: Self-pay

## 2012-08-10 DIAGNOSIS — Z1231 Encounter for screening mammogram for malignant neoplasm of breast: Secondary | ICD-10-CM

## 2012-09-03 ENCOUNTER — Ambulatory Visit
Admission: RE | Admit: 2012-09-03 | Discharge: 2012-09-03 | Disposition: A | Discharge status: Home / Self Care | Payer: 59 | Source: Ambulatory Visit

## 2012-09-03 DIAGNOSIS — Z1231 Encounter for screening mammogram for malignant neoplasm of breast: Secondary | ICD-10-CM

## 2012-10-22 ENCOUNTER — Ambulatory Visit (INDEPENDENT_AMBULATORY_CARE_PROVIDER_SITE_OTHER): Payer: 59 | Admitting: Gynecology

## 2012-10-22 ENCOUNTER — Encounter: Payer: Self-pay | Admitting: Gynecology

## 2012-10-22 VITALS — BP 130/88 | Ht 66.75 in | Wt 160.0 lb

## 2012-10-22 DIAGNOSIS — N951 Menopausal and female climacteric states: Secondary | ICD-10-CM

## 2012-10-22 DIAGNOSIS — Z780 Asymptomatic menopausal state: Secondary | ICD-10-CM

## 2012-10-22 DIAGNOSIS — Z01419 Encounter for gynecological examination (general) (routine) without abnormal findings: Secondary | ICD-10-CM

## 2012-10-22 NOTE — Progress Notes (Signed)
Courtney Baxter 11-25-1955 [id number]   History:    57 y.o.  for annual gyn exam when no complaints today. Last year she was prescribed Vagifem 10 mcg twice a week vaginally for vaginal atrophy and she has discontinued. Patient has no vasomotor symptoms.Patient with past history of total abdominal hysterectomy and bilateral salpingo-oophorectomy. Her primary physician is Dr. Laurann Montana who has done her labs recently. Review of patient's records indicated she had a normal colonoscopy in 2009. Her last bone density study was in 2011 which was normal. She is taking calcium and vitamin D for osteoporosis prevention.Patient with no past history of abnormal Pap smears. Patient had a normal mammogram in August of this year.    Past medical history,surgical history, family history and social history were all reviewed and documented in the EPIC chart.  Gynecologic History No LMP recorded. Patient has had a hysterectomy. Contraception: status post hysterectomy Last Pap: 2012. Results were: normal Last mammogram: 2014. Results were: normal  Obstetric History OB History  Gravida Para Term Preterm AB SAB TAB Ectopic Multiple Living  2 2        2     # Outcome Date GA Lbr Len/2nd Weight Sex Delivery Anes PTL Lv  2 PAR     F SVD     1 PAR     F SVD          ROS: A ROS was performed and pertinent positives and negatives are included in the history.  GENERAL: No fevers or chills. HEENT: No change in vision, no earache, sore throat or sinus congestion. NECK: No pain or stiffness. CARDIOVASCULAR: No chest pain or pressure. No palpitations. PULMONARY: No shortness of breath, cough or wheeze. GASTROINTESTINAL: No abdominal pain, nausea, vomiting or diarrhea, melena or bright red blood per rectum. GENITOURINARY: No urinary frequency, urgency, hesitancy or dysuria. MUSCULOSKELETAL: No joint or muscle pain, no back pain, no recent trauma. DERMATOLOGIC: No rash, no itching, no lesions. ENDOCRINE: No polyuria,  polydipsia, no heat or cold intolerance. No recent change in weight. HEMATOLOGICAL: No anemia or easy bruising or bleeding. NEUROLOGIC: No headache, seizures, numbness, tingling or weakness. PSYCHIATRIC: No depression, no loss of interest in normal activity or change in sleep pattern.     Exam: chaperone present  BP 130/88  Ht 5' 6.75" (1.695 m)  Wt 160 lb (72.576 kg)  BMI 25.26 kg/m2  Body mass index is 25.26 kg/(m^2).  General appearance : Well developed well nourished female. No acute distress HEENT: Neck supple, trachea midline, no carotid bruits, no thyroidmegaly Lungs: Clear to auscultation, no rhonchi or wheezes, or rib retractions  Heart: Regular rate and rhythm, no murmurs or gallops Breast:Examined in sitting and supine position were symmetrical in appearance, no palpable masses or tenderness,  no skin retraction, no nipple inversion, no nipple discharge, no skin discoloration, no axillary or supraclavicular lymphadenopathy Abdomen: no palpable masses or tenderness, no rebound or guarding Extremities: no edema or skin discoloration or tenderness  Pelvic:  Bartholin, Urethra, Skene Glands: Within normal limits             Vagina: No gross lesions or discharge  Cervix:absent  Uterus  absence  Adnexa  Without masses or tenderness  Anus and perineum  normal   Rectovaginal  normal sphincter tone without palpated masses or tenderness             Hemoccult card provided     Assessment/Plan:  57 y.o. female for annual exam who has had a previous  TAH/BSO. Patient with no visible or symptoms. Patient's PCP Dr. Cliffton Asters will be drawn her lab work in the next few weeks. Patient to schedule her bone density study in the next few weeks. Patient was run to do her monthly breast exam. Prescription for shingles IC was provided. Patient received a Tdap vaccine last year. We discussed importance of calcium vitamin D and regular exercise for osteoporosis prevention. Posterior not done today in  accordance to new guidelines.  Note: This dictation was prepared with  Dragon/digital dictation along withSmart phrase technology. Any transcriptional errors that result from this process are unintentional.   Ok Edwards MD, 2:51 PM 10/22/2012

## 2012-10-22 NOTE — Patient Instructions (Addendum)
Shingles Vaccine What You Need to Know WHAT IS SHINGLES?  Shingles is a painful skin rash, often with blisters. It is also called Herpes Zoster or just Zoster.  A shingles rash usually appears on one side of the face or body and lasts from 2 to 4 weeks. Its main symptom is pain, which can be quite severe. Other symptoms of shingles can include fever, headache, chills, and upset stomach. Very rarely, a shingles infection can lead to pneumonia, hearing problems, blindness, brain inflammation (encephalitis), or death.  For about 1 person in 5, severe pain can continue even after the rash clears up. This is called post-herpetic neuralgia.  Shingles is caused by the Varicella Zoster virus. This is the same virus that causes chickenpox. Only someone who has had a case of chickenpox or rarely, has gotten chickenpox vaccine, can get shingles. The virus stays in your body. It can reappear many years later to cause a case of shingles.  You cannot catch shingles from another person with shingles. However, a person who has never had chickenpox (or chickenpox vaccine) could get chickenpox from someone with shingles. This is not very common.  Shingles is far more common in people 50 and older than in younger people. It is also more common in people whose immune systems are weakened because of a disease such as cancer or drugs such as steroids or chemotherapy.  At least 1 million people get shingles per year in the [place]. SHINGLES VACCINE  A vaccine for shingles was licensed in 2006. In clinical trials, the vaccine reduced the risk of shingles by 50%. It can also reduce the pain in people who still get shingles after being vaccinated.  A single dose of shingles vaccine is recommended for adults 60 years of age and older. SOME PEOPLE SHOULD NOT GET SHINGLES VACCINE OR SHOULD WAIT A person should not get shingles vaccine if he or she:  Has ever had a life-threatening allergic reaction to gelatin, the  antibiotic neomycin, or any other component of shingles vaccine. Tell your caregiver if you have any severe allergies.  Has a weakened immune system because of current:  AIDS or another disease that affects the immune system.  Treatment with drugs that affect the immune system, such as prolonged use of high-dose steroids.  Cancer treatment, such as radiation or chemotherapy.  Cancer affecting the bone marrow or lymphatic system, such as leukemia or lymphoma.  Is pregnant, or might be pregnant. Women should not become pregnant until at least 4 weeks after getting shingles vaccine. Someone with a minor illness, such as a cold, may be vaccinated. Anyone with a moderate or severe acute illness should usually wait until he or she recovers before getting the vaccine. This includes anyone with a temperature of 101.3 F (38 C) or higher. WHAT ARE THE RISKS FROM SHINGLES VACCINE?  A vaccine, like any medicine, could possibly cause serious problems, such as severe allergic reactions. However, the risk of a vaccine causing serious harm, or death, is extremely small.  No serious problems have been identified with shingles vaccine. Mild Problems  Redness, soreness, swelling, or itching at the site of the injection (about 1 person in 3).  Headache (about 1 person in 70). Like all vaccines, shingles vaccine is being closely monitored for unusual or severe problems. WHAT IF THERE IS A MODERATE OR SEVERE REACTION? What should I look for? Any unusual condition, such as a severe allergic reaction or a high fever. If a severe allergic reaction   occurred, it would be within a few minutes to an hour after the shot. Signs of a serious allergic reaction can include difficulty breathing, weakness, hoarseness or wheezing, a fast heartbeat, hives, dizziness, paleness, or swelling of the throat. What should I do?  Call your caregiver, or get the person to a caregiver right away.  Tell the caregiver what  happened, the date and time it happened, and when the vaccination was given.  Ask the caregiver to report the reaction by filing a Vaccine Adverse Event Reporting System (VAERS) form. Or, you can file this report through the VAERS web site at www.vaers.hhs.gov or by calling 1-800-822-7967. VAERS does not provide medical advice. HOW CAN I LEARN MORE?  Ask your caregiver. He or she can give you the vaccine package insert or suggest other sources of information.  Contact the Centers for Disease Control and Prevention (CDC):  Call 1-800-232-4636 (1-800-CDC-INFO).  Visit the CDC website at www.cdc.gov/vaccines CDC Shingles Vaccine VIS (10/26/07) Document Released: 11/03/2005 Document Revised: 03/31/2011 Document Reviewed: 10/26/2007 [employer] Patient Information 2014 [employer].  

## 2012-11-15 ENCOUNTER — Other Ambulatory Visit: Payer: 59 | Admitting: Anesthesiology

## 2012-11-15 DIAGNOSIS — Z1211 Encounter for screening for malignant neoplasm of colon: Secondary | ICD-10-CM

## 2012-11-25 ENCOUNTER — Other Ambulatory Visit: Payer: Self-pay

## 2012-12-21 ENCOUNTER — Other Ambulatory Visit: Payer: Self-pay | Admitting: Gynecology

## 2012-12-21 ENCOUNTER — Ambulatory Visit (INDEPENDENT_AMBULATORY_CARE_PROVIDER_SITE_OTHER): Payer: 59

## 2012-12-21 DIAGNOSIS — Z13820 Encounter for screening for osteoporosis: Secondary | ICD-10-CM

## 2012-12-21 DIAGNOSIS — Z780 Asymptomatic menopausal state: Secondary | ICD-10-CM

## 2013-01-28 ENCOUNTER — Other Ambulatory Visit: Payer: 59

## 2013-01-28 ENCOUNTER — Other Ambulatory Visit: Payer: Self-pay | Admitting: *Deleted

## 2013-01-28 DIAGNOSIS — M8580 Other specified disorders of bone density and structure, unspecified site: Secondary | ICD-10-CM

## 2013-01-28 DIAGNOSIS — M898X9 Other specified disorders of bone, unspecified site: Secondary | ICD-10-CM

## 2013-01-29 LAB — VITAMIN D 25 HYDROXY (VIT D DEFICIENCY, FRACTURES): Vit D, 25-Hydroxy: 49 ng/mL (ref 30–89)

## 2013-01-31 LAB — PTH, INTACT AND CALCIUM
Calcium: 9.8 mg/dL (ref 8.4–10.5)
PTH: 33.4 pg/mL (ref 14.0–72.0)

## 2013-02-04 ENCOUNTER — Other Ambulatory Visit: Payer: 59

## 2013-07-28 ENCOUNTER — Other Ambulatory Visit: Payer: Self-pay

## 2013-07-28 DIAGNOSIS — Z1231 Encounter for screening mammogram for malignant neoplasm of breast: Secondary | ICD-10-CM

## 2013-09-08 ENCOUNTER — Ambulatory Visit: Payer: 59

## 2013-09-08 ENCOUNTER — Ambulatory Visit
Admission: RE | Admit: 2013-09-08 | Discharge: 2013-09-08 | Disposition: A | Discharge status: Home / Self Care | Payer: 59 | Source: Ambulatory Visit

## 2013-09-08 DIAGNOSIS — Z1231 Encounter for screening mammogram for malignant neoplasm of breast: Secondary | ICD-10-CM

## 2013-09-09 ENCOUNTER — Ambulatory Visit: Payer: 59

## 2013-10-24 ENCOUNTER — Ambulatory Visit (INDEPENDENT_AMBULATORY_CARE_PROVIDER_SITE_OTHER): Payer: 59 | Admitting: Gynecology

## 2013-10-24 ENCOUNTER — Encounter: Payer: Self-pay | Admitting: Gynecology

## 2013-10-24 VITALS — BP 128/84 | Ht 66.75 in | Wt 164.0 lb

## 2013-10-24 DIAGNOSIS — Z01419 Encounter for gynecological examination (general) (routine) without abnormal findings: Secondary | ICD-10-CM

## 2013-10-24 DIAGNOSIS — N952 Postmenopausal atrophic vaginitis: Secondary | ICD-10-CM

## 2013-10-24 DIAGNOSIS — Z79890 Hormone replacement therapy (postmenopausal): Secondary | ICD-10-CM

## 2013-10-24 DIAGNOSIS — Z1159 Encounter for screening for other viral diseases: Secondary | ICD-10-CM

## 2013-10-24 MED ORDER — ESTRADIOL 10 MCG VA TABS: 1.0000 | ORAL_TABLET | VAGINAL | Status: DC

## 2013-10-24 NOTE — Patient Instructions (Signed)
Shingles Vaccine What You Need to Know WHAT IS SHINGLES?  Shingles is a painful skin rash, often with blisters. It is also called Herpes Zoster or just Zoster.  A shingles rash usually appears on one side of the face or body and lasts from 2 to 4 weeks. Its main symptom is pain, which can be quite severe. Other symptoms of shingles can include fever, headache, chills, and upset stomach. Very rarely, a shingles infection can lead to pneumonia, hearing problems, blindness, brain inflammation (encephalitis), or death.  For about 1 person in 5, severe pain can continue even after the rash clears up. This is called post-herpetic neuralgia.  Shingles is caused by the Varicella Zoster virus. This is the same virus that causes chickenpox. Only someone who has had a case of chickenpox or rarely, has gotten chickenpox vaccine, can get shingles. The virus stays in your body. It can reappear many years later to cause a case of shingles.  You cannot catch shingles from another person with shingles. However, a person who has never had chickenpox (or chickenpox vaccine) could get chickenpox from someone with shingles. This is not very common.  Shingles is far more common in people 50 and older than in younger people. It is also more common in people whose immune systems are weakened because of a disease such as cancer or drugs such as steroids or chemotherapy.  At least 1 million people get shingles per year in the [place]. SHINGLES VACCINE  A vaccine for shingles was licensed in 2006. In clinical trials, the vaccine reduced the risk of shingles by 50%. It can also reduce the pain in people who still get shingles after being vaccinated.  A single dose of shingles vaccine is recommended for adults 60 years of age and older. SOME PEOPLE SHOULD NOT GET SHINGLES VACCINE OR SHOULD WAIT A person should not get shingles vaccine if he or she:  Has ever had a life-threatening allergic reaction to gelatin, the  antibiotic neomycin, or any other component of shingles vaccine. Tell your caregiver if you have any severe allergies.  Has a weakened immune system because of current:  AIDS or another disease that affects the immune system.  Treatment with drugs that affect the immune system, such as prolonged use of high-dose steroids.  Cancer treatment, such as radiation or chemotherapy.  Cancer affecting the bone marrow or lymphatic system, such as leukemia or lymphoma.  Is pregnant, or might be pregnant. Women should not become pregnant until at least 4 weeks after getting shingles vaccine. Someone with a minor illness, such as a cold, may be vaccinated. Anyone with a moderate or severe acute illness should usually wait until he or she recovers before getting the vaccine. This includes anyone with a temperature of 101.3 F (38 C) or higher. WHAT ARE THE RISKS FROM SHINGLES VACCINE?  A vaccine, like any medicine, could possibly cause serious problems, such as severe allergic reactions. However, the risk of a vaccine causing serious harm, or death, is extremely small.  No serious problems have been identified with shingles vaccine. Mild Problems  Redness, soreness, swelling, or itching at the site of the injection (about 1 person in 3).  Headache (about 1 person in 70). Like all vaccines, shingles vaccine is being closely monitored for unusual or severe problems. WHAT IF THERE IS A MODERATE OR SEVERE REACTION? What should I look for? Any unusual condition, such as a severe allergic reaction or a high fever. If a severe allergic reaction   occurred, it would be within a few minutes to an hour after the shot. Signs of a serious allergic reaction can include difficulty breathing, weakness, hoarseness or wheezing, a fast heartbeat, hives, dizziness, paleness, or swelling of the throat. What should I do?  Call your caregiver, or get the person to a caregiver right away.  Tell the caregiver what  happened, the date and time it happened, and when the vaccination was given.  Ask the caregiver to report the reaction by filing a Vaccine Adverse Event Reporting System (VAERS) form. Or, you can file this report through the VAERS web site at www.vaers.hhs.gov or by calling 1-800-822-7967. VAERS does not provide medical advice. HOW CAN I LEARN MORE?  Ask your caregiver. He or she can give you the vaccine package insert or suggest other sources of information.  Contact the Centers for Disease Control and Prevention (CDC):  Call 1-800-232-4636 (1-800-CDC-INFO).  Visit the CDC website at www.cdc.gov/vaccines CDC Shingles Vaccine VIS (10/26/07) Document Released: 11/03/2005 Document Revised: 03/31/2011 Document Reviewed: 04/28/2012 ExitCare Patient Information 2015 [employer]. This information is not intended to replace advice given to you by your health care provider. Make sure you discuss any questions you have with your health care provider.  

## 2013-10-24 NOTE — Progress Notes (Signed)
Courtney Baxter 30-Oct-1955 [id number]   History:    58 y.o.  for with complaining of vaginal dryness and irritation. She previously had been prescribed Vagifem 10 mcg twice a week but had discontinued and she would like to return to using it.Patient with past history of total abdominal hysterectomy and bilateral salpingo-oophorectomy. Her primary physician is Dr. Harlan Stains who has done her labs recently. Review of patient's records indicated she had a normal colonoscopy in 2009. Previous last bone density study 2014 with normal. She is taking her calcium and vitamin D for osteoporosis prevention. Patient with no past history of abnormal Pap smear. Patient had her flu vaccine September 10 at her work.   Past medical history,surgical history, family history and social history were all reviewed and documented in the EPIC chart.  Gynecologic History No LMP recorded. Patient has had a hysterectomy. Contraception: post menopausal status Last Pap: 2012. Results were: normal Last mammogram: 2015. Results were: normal  Obstetric History OB History  Gravida Para Term Preterm AB SAB TAB Ectopic Multiple Living  2 2        2     # Outcome Date GA Lbr Len/2nd Weight Sex Delivery Anes PTL Lv  2 PAR     F SVD     1 PAR     F SVD          ROS: A ROS was performed and pertinent positives and negatives are included in the history.  GENERAL: No fevers or chills. HEENT: No change in vision, no earache, sore throat or sinus congestion. NECK: No pain or stiffness. CARDIOVASCULAR: No chest pain or pressure. No palpitations. PULMONARY: No shortness of breath, cough or wheeze. GASTROINTESTINAL: No abdominal pain, nausea, vomiting or diarrhea, melena or bright red blood per rectum. GENITOURINARY: No urinary frequency, urgency, hesitancy or dysuria. MUSCULOSKELETAL: No joint or muscle pain, no back pain, no recent trauma. DERMATOLOGIC: No rash, no itching, no lesions. ENDOCRINE: No polyuria, polydipsia, no  heat or cold intolerance. No recent change in weight. HEMATOLOGICAL: No anemia or easy bruising or bleeding. NEUROLOGIC: No headache, seizures, numbness, tingling or weakness. PSYCHIATRIC: No depression, no loss of interest in normal activity or change in sleep pattern.     Exam: chaperone present  BP 128/84  Ht 5' 6.75" (1.695 m)  Wt 164 lb (74.39 kg)  BMI 25.89 kg/m2  Body mass index is 25.89 kg/(m^2).  General appearance : Well developed well nourished female. No acute distress HEENT: Neck supple, trachea midline, no carotid bruits, no thyroidmegaly Lungs: Clear to auscultation, no rhonchi or wheezes, or rib retractions  Heart: Regular rate and rhythm, no murmurs or gallops Breast:Examined in sitting and supine position were symmetrical in appearance, no palpable masses or tenderness,  no skin retraction, no nipple inversion, no nipple discharge, no skin discoloration, no axillary or supraclavicular lymphadenopathy Abdomen: no palpable masses or tenderness, no rebound or guarding Extremities: no edema or skin discoloration or tenderness  Pelvic:  Bartholin, Urethra, Skene Glands: Within normal limits             Vagina: No gross lesions or discharge, vaginal atrophy  Cervix: Absent  Uterus absent  Adnexa  Without masses or tenderness  Anus and perineum  normal   Rectovaginal  normal sphincter tone without palpated masses or tenderness             Hemoccult cards will be provided     Assessment/Plan:  58 y.o. female for annual exam with vaginal  atrophy will be prescribed Vagifem 10 mcg to apply intravaginally twice a week. Risks benefits and pros and cons were discussed. Patient will return back next week and a fasting state for the following labs: Comprehensive metabolic panel, a fasting lipid profile, TSH, CBC, and urinalysis. Pap smear was not done today in accordance to nearby lines. We discussed importance of monthly breast exams. Discussed importance of calcium vitamin D and  regular exercise for osteoporosis prevention.  New CDC guidelines is recommending patients be tested once in her lifetime for hepatitis C antibody who were born between [birth year] through 1964. This was discussed with the patient today and has agreed to be tested today.   Terrance Mass MD, 3:28 PM 10/24/2013

## 2013-11-01 ENCOUNTER — Other Ambulatory Visit: Payer: 59

## 2013-11-01 DIAGNOSIS — Z01419 Encounter for gynecological examination (general) (routine) without abnormal findings: Secondary | ICD-10-CM

## 2013-11-01 DIAGNOSIS — Z1159 Encounter for screening for other viral diseases: Secondary | ICD-10-CM

## 2013-11-01 LAB — CBC WITH DIFFERENTIAL/PLATELET
Basophils Absolute: 0.1 10*3/uL (ref 0.0–0.1)
Basophils Relative: 1 % (ref 0–1)
EOS PCT: 2 % (ref 0–5)
Eosinophils Absolute: 0.1 10*3/uL (ref 0.0–0.7)
HCT: 37.5 % (ref 36.0–46.0)
HEMOGLOBIN: 12.4 g/dL (ref 12.0–15.0)
LYMPHS ABS: 2.1 10*3/uL (ref 0.7–4.0)
LYMPHS PCT: 36 % (ref 12–46)
MCH: 28.6 pg (ref 26.0–34.0)
MCHC: 33.1 g/dL (ref 30.0–36.0)
MCV: 86.6 fL (ref 78.0–100.0)
MONOS PCT: 7 % (ref 3–12)
Monocytes Absolute: 0.4 10*3/uL (ref 0.1–1.0)
Neutro Abs: 3.1 10*3/uL (ref 1.7–7.7)
Neutrophils Relative %: 54 % (ref 43–77)
PLATELETS: 367 10*3/uL (ref 150–400)
RBC: 4.33 MIL/uL (ref 3.87–5.11)
RDW: 13.5 % (ref 11.5–15.5)
WBC: 5.8 10*3/uL (ref 4.0–10.5)

## 2013-11-01 LAB — COMPREHENSIVE METABOLIC PANEL
ALT: 10 U/L (ref 0–35)
AST: 14 U/L (ref 0–37)
Albumin: 4.1 g/dL (ref 3.5–5.2)
Alkaline Phosphatase: 81 U/L (ref 39–117)
BUN: 12 mg/dL (ref 6–23)
CALCIUM: 9.6 mg/dL (ref 8.4–10.5)
CHLORIDE: 100 meq/L (ref 96–112)
CO2: 26 meq/L (ref 19–32)
CREATININE: 0.81 mg/dL (ref 0.50–1.10)
GLUCOSE: 94 mg/dL (ref 70–99)
Potassium: 4.2 mEq/L (ref 3.5–5.3)
SODIUM: 138 meq/L (ref 135–145)
TOTAL PROTEIN: 6.9 g/dL (ref 6.0–8.3)
Total Bilirubin: 0.3 mg/dL (ref 0.2–1.2)

## 2013-11-01 LAB — LIPID PANEL
CHOLESTEROL: 205 mg/dL — AB (ref 0–200)
HDL: 49 mg/dL (ref >39)
LDL Cholesterol: 115 mg/dL — ABNORMAL HIGH (ref 0–99)
TRIGLYCERIDES: 207 mg/dL — AB (ref <150)
Total CHOL/HDL Ratio: 4.2 Ratio
VLDL: 41 mg/dL — ABNORMAL HIGH (ref 0–40)

## 2013-11-01 LAB — TSH: TSH: 1.534 u[IU]/mL (ref 0.350–4.500)

## 2013-11-02 LAB — URINALYSIS W MICROSCOPIC + REFLEX CULTURE
Bilirubin Urine: NEGATIVE
CASTS: NONE SEEN
CRYSTALS: NONE SEEN
GLUCOSE, UA: NEGATIVE mg/dL
Ketones, ur: NEGATIVE mg/dL
LEUKOCYTES UA: NEGATIVE
Nitrite: NEGATIVE
PH: 6.5 (ref 5.0–8.0)
Protein, ur: NEGATIVE mg/dL
SQUAMOUS EPITHELIAL / LPF: NONE SEEN
Specific Gravity, Urine: 1.018 (ref 1.005–1.030)
Urobilinogen, UA: 0.2 mg/dL (ref 0.0–1.0)

## 2013-11-02 LAB — HEPATITIS C ANTIBODY: HCV Ab: NEGATIVE

## 2013-11-03 LAB — URINE CULTURE: Colony Count: >=100000

## 2013-11-07 ENCOUNTER — Other Ambulatory Visit: Payer: Self-pay | Admitting: Gynecology

## 2013-11-07 DIAGNOSIS — R3129 Other microscopic hematuria: Secondary | ICD-10-CM

## 2013-11-09 ENCOUNTER — Other Ambulatory Visit: Payer: 59

## 2013-11-10 ENCOUNTER — Other Ambulatory Visit: Payer: 59

## 2013-11-10 DIAGNOSIS — R3129 Other microscopic hematuria: Secondary | ICD-10-CM

## 2013-11-11 LAB — URINALYSIS W MICROSCOPIC + REFLEX CULTURE
BACTERIA UA: NONE SEEN
Bilirubin Urine: NEGATIVE
Casts: NONE SEEN
Crystals: NONE SEEN
Glucose, UA: NEGATIVE mg/dL
Ketones, ur: NEGATIVE mg/dL
LEUKOCYTES UA: NEGATIVE
Nitrite: NEGATIVE
Protein, ur: NEGATIVE mg/dL
Specific Gravity, Urine: 1.017 (ref 1.005–1.030)
Squamous Epithelial / LPF: NONE SEEN
Urobilinogen, UA: 0.2 mg/dL (ref 0.0–1.0)
pH: 6.5 (ref 5.0–8.0)

## 2013-11-12 LAB — URINE CULTURE

## 2013-11-17 ENCOUNTER — Telehealth: Payer: Self-pay | Admitting: *Deleted

## 2013-11-17 NOTE — Telephone Encounter (Signed)
Message copied by Thamas Jaegers on Thu Nov 17, 2013  2:13 PM ------      Message from: Ramond Craver      Created: Wed Nov 16, 2013 12:57 PM      Regarding: Referral to urologist       Notes Recorded by Terrance Mass, MD on 11/11/2013 at 7:55 AM      Please inform patient that I would like to refer her to the urologist as a result of her persistent microscopic hematuria and negative culture. Please make appointment for her at [facility].            Patient informed. She will expect to hear from you. Thanks!!! ------

## 2013-11-17 NOTE — Telephone Encounter (Signed)
Left message for pt to call regarding appointment on 12/13/13 @ 2:15pm notes faxed.

## 2013-11-18 NOTE — Telephone Encounter (Signed)
Pt informed with appointment with Dr.Herrick

## 2013-11-21 ENCOUNTER — Encounter: Payer: Self-pay | Admitting: Gynecology

## 2014-03-10 ENCOUNTER — Emergency Department (HOSPITAL_COMMUNITY): Payer: No Typology Code available for payment source

## 2014-03-10 ENCOUNTER — Emergency Department (HOSPITAL_COMMUNITY)
Admission: EM | Admit: 2014-03-10 | Discharge: 2014-03-10 | Disposition: A | Discharge status: Home / Self Care | Payer: No Typology Code available for payment source | Attending: Emergency Medicine | Admitting: Emergency Medicine

## 2014-03-10 ENCOUNTER — Encounter (HOSPITAL_COMMUNITY): Payer: Self-pay | Admitting: *Deleted

## 2014-03-10 DIAGNOSIS — S6992XA Unspecified injury of left wrist, hand and finger(s), initial encounter: Secondary | ICD-10-CM | POA: Insufficient documentation

## 2014-03-10 DIAGNOSIS — Y998 Other external cause status: Secondary | ICD-10-CM | POA: Insufficient documentation

## 2014-03-10 DIAGNOSIS — Y9389 Activity, other specified: Secondary | ICD-10-CM | POA: Diagnosis not present

## 2014-03-10 DIAGNOSIS — S299XXA Unspecified injury of thorax, initial encounter: Secondary | ICD-10-CM | POA: Diagnosis not present

## 2014-03-10 DIAGNOSIS — R0781 Pleurodynia: Secondary | ICD-10-CM

## 2014-03-10 DIAGNOSIS — Z8742 Personal history of other diseases of the female genital tract: Secondary | ICD-10-CM | POA: Insufficient documentation

## 2014-03-10 DIAGNOSIS — Y92410 Unspecified street and highway as the place of occurrence of the external cause: Secondary | ICD-10-CM | POA: Diagnosis not present

## 2014-03-10 MED ORDER — HYDROCODONE-ACETAMINOPHEN 5-325 MG PO TABS: 2.0000 | ORAL_TABLET | ORAL | Status: DC | PRN
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
## 2014-03-10 NOTE — Discharge Instructions (Signed)
Take Vicodin as needed for pain. Refer to attached documents for more information. Return to the ED with worsening or concerning symptoms.

## 2014-03-16 ENCOUNTER — Encounter (HOSPITAL_COMMUNITY): Payer: Self-pay

## 2014-03-16 ENCOUNTER — Emergency Department (HOSPITAL_COMMUNITY)
Admission: EM | Admit: 2014-03-16 | Discharge: 2014-03-16 | Disposition: A | Discharge status: Home / Self Care | Payer: Commercial Managed Care - HMO | Attending: Emergency Medicine | Admitting: Emergency Medicine

## 2014-03-16 DIAGNOSIS — M542 Cervicalgia: Secondary | ICD-10-CM | POA: Diagnosis not present

## 2014-03-16 DIAGNOSIS — G8911 Acute pain due to trauma: Secondary | ICD-10-CM | POA: Diagnosis not present

## 2014-03-16 DIAGNOSIS — Z79899 Other long term (current) drug therapy: Secondary | ICD-10-CM | POA: Diagnosis not present

## 2014-03-16 DIAGNOSIS — Z8742 Personal history of other diseases of the female genital tract: Secondary | ICD-10-CM | POA: Diagnosis not present

## 2014-03-16 MED ORDER — HYDROCODONE-ACETAMINOPHEN 5-325 MG PO TABS: 1.0000 | ORAL_TABLET | ORAL | Status: DC | PRN

## 2014-03-16 MED ORDER — METHOCARBAMOL 500 MG PO TABS: 500.0000 mg | ORAL_TABLET | Freq: Two times a day (BID) | ORAL | Status: DC

## 2014-03-16 NOTE — ED Notes (Signed)
NAD at this time. Pt is stable and going home.  

## 2014-03-16 NOTE — Discharge Instructions (Signed)
Take the prescribed medication as directed. °Follow-up with your primary care physician. °Return to the ED for new or worsening symptoms. ° °
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
## 2014-03-16 NOTE — ED Notes (Signed)
Pt states she wants a CT scan.
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
## 2014-08-10 ENCOUNTER — Other Ambulatory Visit: Payer: Self-pay

## 2014-08-10 DIAGNOSIS — Z1231 Encounter for screening mammogram for malignant neoplasm of breast: Secondary | ICD-10-CM

## 2014-09-12 ENCOUNTER — Ambulatory Visit
Admission: RE | Admit: 2014-09-12 | Discharge: 2014-09-12 | Disposition: A | Discharge status: Home / Self Care | Payer: Commercial Managed Care - HMO | Source: Ambulatory Visit

## 2014-09-12 DIAGNOSIS — Z1231 Encounter for screening mammogram for malignant neoplasm of breast: Secondary | ICD-10-CM

## 2014-10-26 ENCOUNTER — Ambulatory Visit (INDEPENDENT_AMBULATORY_CARE_PROVIDER_SITE_OTHER): Payer: Commercial Managed Care - HMO | Admitting: Gynecology

## 2014-10-26 ENCOUNTER — Encounter: Payer: Self-pay | Admitting: Gynecology

## 2014-10-26 VITALS — BP 130/84 | Ht 67.0 in | Wt 163.6 lb

## 2014-10-26 DIAGNOSIS — Z780 Asymptomatic menopausal state: Secondary | ICD-10-CM

## 2014-10-26 DIAGNOSIS — Z01419 Encounter for gynecological examination (general) (routine) without abnormal findings: Secondary | ICD-10-CM

## 2014-10-26 NOTE — Progress Notes (Signed)
Courtney Baxter [birth year] [id number]   History:    59 y.o.  for annual gyn exam with no complaints today. She is no longer using the Vagifem for vaginal dryness and irritation. Patient with past history of total abdominal hysterectomy with bilateral salpingo-oophorectomy. Her PCP is Dr. Harlan Stains but she has not had her blood work done and will return later next week for her fasting blood work here in our office.Review of patient's records indicated she had a normal colonoscopy in 2009. Previous last bone density study 2014 with normal. She is taking her calcium and vitamin D for osteoporosis prevention. Patient with no past history of abnormal Pap smear. Patient had her flu vaccine  at her work.  Past medical history,surgical history, family history and social history were all reviewed and documented in the EPIC chart.  Gynecologic History No LMP recorded. Patient has had a hysterectomy. Contraception: status post hysterectomy Last Pap: 2012. Results were: normal Last mammogram: 2016. Results were: normal  Obstetric History OB History  Gravida Para Term Preterm AB SAB TAB Ectopic Multiple Living  2 2        2     # Outcome Date GA Lbr Len/2nd Weight Sex Delivery Anes PTL Lv  2 Para     F Vag-Spont     1 Para     F Vag-Spont          ROS: A ROS was performed and pertinent positives and negatives are included in the history.  GENERAL: No fevers or chills. HEENT: No change in vision, no earache, sore throat or sinus congestion. NECK: No pain or stiffness. CARDIOVASCULAR: No chest pain or pressure. No palpitations. PULMONARY: No shortness of breath, cough or wheeze. GASTROINTESTINAL: No abdominal pain, nausea, vomiting or diarrhea, melena or bright red blood per rectum. GENITOURINARY: No urinary frequency, urgency, hesitancy or dysuria. MUSCULOSKELETAL: No joint or muscle pain, no back pain, no recent trauma. DERMATOLOGIC: No rash, no itching, no lesions. ENDOCRINE: No polyuria,  polydipsia, no heat or cold intolerance. No recent change in weight. HEMATOLOGICAL: No anemia or easy bruising or bleeding. NEUROLOGIC: No headache, seizures, numbness, tingling or weakness. PSYCHIATRIC: No depression, no loss of interest in normal activity or change in sleep pattern.     Exam: chaperone present  BP 130/84 mmHg  Ht 5\' 7"  (1.702 m)  Wt 163 lb 9.6 oz (74.208 kg)  BMI 25.62 kg/m2  Body mass index is 25.62 kg/(m^2).  General appearance : Well developed well nourished female. No acute distress HEENT: Eyes: no retinal hemorrhage or exudates,  Neck supple, trachea midline, no carotid bruits, no thyroidmegaly Lungs: Clear to auscultation, no rhonchi or wheezes, or rib retractions  Heart: Regular rate and rhythm, no murmurs or gallops Breast:Examined in sitting and supine position were symmetrical in appearance, no palpable masses or tenderness,  no skin retraction, no nipple inversion, no nipple discharge, no skin discoloration, no axillary or supraclavicular lymphadenopathy Abdomen: no palpable masses or tenderness, no rebound or guarding Extremities: no edema or skin discoloration or tenderness  Pelvic:  Bartholin, Urethra, Skene Glands: Within normal limits             Vagina: No gross lesions or discharge  Cervix: Absent  Uterus  absent  Adnexa  Without masses or tenderness  Anus and perineum  normal   Rectovaginal  normal sphincter tone without palpated masses or tenderness             Hemoccult PCP will provide  Assessment/Plan:  59 y.o. female for annual exam doing well no longer on vaginal estrogen cream. Patient will return back later this week in a fasting state for the following lab work: Comprehensive metabolic panel, fasting lipid profile, TSH, CBC, and urinalysis. Patient to schedule bone density study at the end of this year. We discussed importance of calcium vitamin D and regular exercise for osteoporosis prevention. Pap smear no longer indicated  according to the new guidelines.   Terrance Mass MD, 4:28 PM 10/26/2014

## 2014-10-26 NOTE — Patient Instructions (Signed)

## 2014-10-27 ENCOUNTER — Other Ambulatory Visit: Payer: Commercial Managed Care - HMO

## 2014-10-27 DIAGNOSIS — Z01419 Encounter for gynecological examination (general) (routine) without abnormal findings: Secondary | ICD-10-CM

## 2014-10-27 LAB — LIPID PANEL
CHOL/HDL RATIO: 4.6 ratio (ref <=5.0)
CHOLESTEROL: 208 mg/dL — AB (ref 125–200)
HDL: 45 mg/dL — AB (ref >=46)
LDL Cholesterol: 130 mg/dL — ABNORMAL HIGH (ref <130)
TRIGLYCERIDES: 164 mg/dL — AB (ref <150)
VLDL: 33 mg/dL — AB (ref <30)

## 2014-10-27 LAB — CBC WITH DIFFERENTIAL/PLATELET
Basophils Absolute: 0 10*3/uL (ref 0.0–0.1)
Basophils Relative: 0 % (ref 0–1)
Eosinophils Absolute: 0.1 10*3/uL (ref 0.0–0.7)
Eosinophils Relative: 2 % (ref 0–5)
HCT: 37.2 % (ref 36.0–46.0)
HEMOGLOBIN: 12.1 g/dL (ref 12.0–15.0)
LYMPHS ABS: 2.2 10*3/uL (ref 0.7–4.0)
Lymphocytes Relative: 33 % (ref 12–46)
MCH: 28.5 pg (ref 26.0–34.0)
MCHC: 32.5 g/dL (ref 30.0–36.0)
MCV: 87.5 fL (ref 78.0–100.0)
MONO ABS: 0.5 10*3/uL (ref 0.1–1.0)
MONOS PCT: 7 % (ref 3–12)
MPV: 9.1 fL (ref 8.6–12.4)
NEUTROS ABS: 3.8 10*3/uL (ref 1.7–7.7)
NEUTROS PCT: 58 % (ref 43–77)
Platelets: 399 10*3/uL (ref 150–400)
RBC: 4.25 MIL/uL (ref 3.87–5.11)
RDW: 13.4 % (ref 11.5–15.5)
WBC: 6.6 10*3/uL (ref 4.0–10.5)

## 2014-10-27 LAB — COMPREHENSIVE METABOLIC PANEL
ALBUMIN: 4 g/dL (ref 3.6–5.1)
ALT: 9 U/L (ref 6–29)
AST: 12 U/L (ref 10–35)
Alkaline Phosphatase: 84 U/L (ref 33–130)
BUN: 16 mg/dL (ref 7–25)
CALCIUM: 9.1 mg/dL (ref 8.6–10.4)
CHLORIDE: 102 mmol/L (ref 98–110)
CO2: 28 mmol/L (ref 20–31)
Creat: 0.85 mg/dL (ref 0.50–1.05)
Glucose, Bld: 82 mg/dL (ref 65–99)
POTASSIUM: 4.5 mmol/L (ref 3.5–5.3)
Sodium: 138 mmol/L (ref 135–146)
TOTAL PROTEIN: 6.7 g/dL (ref 6.1–8.1)
Total Bilirubin: 0.3 mg/dL (ref 0.2–1.2)

## 2014-10-28 LAB — URINALYSIS W MICROSCOPIC + REFLEX CULTURE
BACTERIA UA: NONE SEEN [HPF]
BILIRUBIN URINE: NEGATIVE
CRYSTALS: NONE SEEN [HPF]
Casts: NONE SEEN [LPF]
GLUCOSE, UA: NEGATIVE
KETONES UR: NEGATIVE
LEUKOCYTES UA: NEGATIVE
Nitrite: NEGATIVE
Protein, ur: NEGATIVE
SPECIFIC GRAVITY, URINE: 1.015 (ref 1.001–1.035)
Yeast: NONE SEEN [HPF]
pH: 6 (ref 5.0–8.0)

## 2014-10-28 LAB — TSH: TSH: 1.331 u[IU]/mL (ref 0.350–4.500)

## 2014-10-29 LAB — URINE CULTURE: Colony Count: 50000

## 2014-11-07 ENCOUNTER — Encounter: Payer: Self-pay | Admitting: Medical

## 2014-11-07 ENCOUNTER — Ambulatory Visit (INDEPENDENT_AMBULATORY_CARE_PROVIDER_SITE_OTHER): Payer: Commercial Managed Care - HMO | Admitting: Medical

## 2014-11-07 VITALS — BP 120/80 | HR 56 | Ht 66.5 in | Wt 164.0 lb

## 2014-11-07 DIAGNOSIS — E785 Hyperlipidemia, unspecified: Secondary | ICD-10-CM

## 2014-11-07 NOTE — Progress Notes (Signed)
Subjective: Here as a new patient.   Referred by her OB/Gyn Dr. Uvaldo Rising for f/u on abnormal labs.  Recently her gynecologist did labs and wanted her to f/u on lipid labs.  Doing fine in general, no major issues.  Nonsmoker.  Non drinker.  Exercises 3-4 days per week with 30 minutes or so with combination of weights, walking, treadmill, bicycle, has a gym at work.   Works for [employer] [place] [employer].  at age 59 started exercising daily, and has been consistent with this for 18 years.  Diet - weakness is peach cobbler or cake, and eats this 1-2 times per week.  Eats when she has to eat, not necessarily having cravings.  Does not eat pork, doesn't eat much steak.  Mostly eats chicken or [place].  doesn't eat much seafood.   Eats some vegetables and fruits but could do better at this.  Sometimes eats grains.    Breakfast is typically yogurt, bagel Lunch is typically skipped or salad Dinner is typically salad or meat.  Past Medical History  Diagnosis Date  . NSVD (normal spontaneous vaginal delivery)     x2  . Fibroid    ROS as in subjective   Objective: BP 120/80 mmHg  Pulse 56  Ht 5' 6.5" (1.689 m)  Wt 164 lb (74.39 kg)  BMI 26.08 kg/m2  General appearance: alert, no distress, WD/WN, AA female Oral cavity: MMM, no lesions Neck: supple, no lymphadenopathy, no thyromegaly, no masses Heart: RRR, normal S1, S2, no murmurs Lungs: CTA bilaterally, no wheezes, rhonchi, or rales Pulses: 2+ symmetric, upper and lower extremities, normal cap refill    Assessment: Encounter Diagnosis  Name Primary?  . Dyslipidemia Yes    Plan: Reviewed her recent physical notes and labs from gynecology.  She has mixed dyslipidemia with low HDL mildly elevated LDL and ratio.  Her risk factors are low HDL.  Her 10 year cardiac risks is a little over 4%.   We discuses the fact that she is exercising regularly.  She could add some beneficial diet measures.   We discussed mediterranean diet, adding  heart and cholesterol healthy foods.   Limit the cobbler and cake, but otherwise she is not eating particularly unhealthy.   She wants to try diet changes first.   Current guidelines USPSTF advise against aspirin for cardiac prevention in her case.    Plan to recheck lipids in 35mo.   If not at goal then, consider medication to help reduce her risk factors.  She seems otherwise healthy.

## 2014-11-07 NOTE — Patient Instructions (Signed)
Why follow it? Research shows. . Those who follow the Mediterranean diet have a reduced risk of heart disease  . The diet is associated with a reduced incidence of Parkinson's and Alzheimer's diseases . People following the diet may have longer life expectancies and lower rates of chronic diseases  . The Dietary Guidelines for Americans recommends the Mediterranean diet as an eating plan to promote health and prevent disease  What Is the Mediterranean Diet?  . Healthy eating plan based on typical foods and recipes of Mediterranean-style cooking . The diet is primarily a plant based diet; these foods should make up a majority of meals   Starches - Plant based foods should make up a majority of meals - They are an important sources of vitamins, minerals, energy, antioxidants, and fiber - Choose whole grains, foods high in fiber and minimally processed items  - Typical grain sources include wheat, oats, barley, corn, brown rice, bulgar, farro, millet, polenta, couscous  - Various types of beans include chickpeas, lentils, fava beans, black beans, white beans   Fruits  Veggies - Large quantities of antioxidant rich fruits & veggies; 6 or more servings  - Vegetables can be eaten raw or lightly drizzled with oil and cooked  - Vegetables common to the traditional Mediterranean Diet include: artichokes, arugula, beets, broccoli, brussel sprouts, cabbage, carrots, celery, collard greens, cucumbers, eggplant, kale, leeks, lemons, lettuce, mushrooms, okra, onions, peas, peppers, potatoes, pumpkin, radishes, rutabaga, shallots, spinach, sweet potatoes, turnips, zucchini - Fruits common to the Mediterranean Diet include: apples, apricots, avocados, cherries, clementines, dates, figs, grapefruits, grapes, melons, nectarines, oranges, peaches, pears, pomegranates, strawberries, tangerines  Fats - Replace butter and margarine with healthy oils, such as olive oil, canola oil, and tahini  - Limit nuts to no  more than a handful a day  - Nuts include walnuts, almonds, pecans, pistachios, pine nuts  - Limit or avoid candied, honey roasted or heavily salted nuts - Olives are central to the Mediterranean diet - can be eaten whole or used in a variety of dishes   Meats Protein - Limiting red meat: no more than a few times a month - When eating red meat: choose lean cuts and keep the portion to the size of deck of cards - Eggs: approx. 0 to 4 times a week  - Fish and lean poultry: at least 2 a week  - Healthy protein sources include, chicken, turkey, lean beef, lamb - Increase intake of seafood such as tuna, salmon, trout, mackerel, shrimp, scallops - Avoid or limit high fat processed meats such as sausage and bacon  Dairy - Include moderate amounts of low fat dairy products  - Focus on healthy dairy such as fat free yogurt, skim milk, low or reduced fat cheese - Limit dairy products higher in fat such as whole or 2% milk, cheese, ice cream  Alcohol - Moderate amounts of red wine is ok  - No more than 5 oz daily for women (all ages) and men older than age 65  - No more than 10 oz of wine daily for men younger than 65  Other - Limit sweets and other desserts  - Use herbs and spices instead of salt to flavor foods  - Herbs and spices common to the traditional Mediterranean Diet include: basil, bay leaves, chives, cloves, cumin, fennel, garlic, lavender, marjoram, mint, oregano, parsley, pepper, rosemary, sage, savory, sumac, tarragon, thyme   It's not just a diet, it's a lifestyle:  . The Mediterranean diet includes   lifestyle factors typical of those in the region  . Foods, drinks and meals are best eaten with others and savored . Daily physical activity is important for overall good health . This could be strenuous exercise like running and aerobics . This could also be more leisurely activities such as walking, housework, yard-work, or taking the stairs . Moderation is the key; a balanced and  healthy diet accommodates most foods and drinks . Consider portion sizes and frequency of consumption of certain foods   Meal Ideas & Options:  . Breakfast:  o Whole wheat toast or whole wheat English muffins with peanut butter & hard boiled egg o Steel cut oats topped with apples & cinnamon and skim milk  o Fresh fruit: banana, strawberries, melon, berries, peaches  o Smoothies: strawberries, bananas, greek yogurt, peanut butter o Low fat greek yogurt with blueberries and granola  o Egg white omelet with spinach and mushrooms o Breakfast couscous: whole wheat couscous, apricots, skim milk, cranberries  . Sandwiches:  o Hummus and grilled vegetables (peppers, zucchini, squash) on whole wheat bread   o Grilled chicken on whole wheat pita with lettuce, tomatoes, cucumbers or tzatziki  o Tuna salad on whole wheat bread: tuna salad made with greek yogurt, olives, red peppers, capers, green onions o Garlic rosemary lamb pita: lamb sauted with garlic, rosemary, salt & pepper; add lettuce, cucumber, greek yogurt to pita - flavor with lemon juice and black pepper  . Seafood:  o Mediterranean grilled salmon, seasoned with garlic, basil, parsley, lemon juice and black pepper o Shrimp, lemon, and spinach whole-grain pasta salad made with low fat greek yogurt  o Seared scallops with lemon orzo  o Seared tuna steaks seasoned salt, pepper, coriander topped with tomato mixture of olives, tomatoes, olive oil, minced garlic, parsley, green onions and cappers  . Meats:  o Herbed greek chicken salad with kalamata olives, cucumber, feta  o Red bell peppers stuffed with spinach, bulgur, lean ground beef (or lentils) & topped with feta   o Kebabs: skewers of chicken, tomatoes, onions, zucchini, squash  o [place] burgers: made with red onions, mint, dill, lemon juice, feta cheese topped with roasted red peppers . Vegetarian o Cucumber salad: cucumbers, artichoke hearts, celery, red onion, feta cheese, tossed in  olive oil & lemon juice  o Hummus and whole grain pita points with a greek salad (lettuce, tomato, feta, olives, cucumbers, red onion) o Lentil soup with celery, carrots made with vegetable broth, garlic, salt and pepper  o Tabouli salad: parsley, bulgur, mint, scallions, cucumbers, tomato, radishes, lemon juice, olive oil, salt and pepper.    Dyslipidemia Dyslipidemia is an imbalance of the lipids in your blood. Lipids are waxy, fat-like proteins that your body needs in small amounts. Dyslipidemia often involves the lipids cholesterol or triglycerides. Common forms of dyslipidemia are:  High levels of bad cholesterol (LDL cholesterol). LDL cholesterol is the type of cholesterol that causes heart disease.  Low levels of good cholesterol (HDL cholesterol). HDL cholesterol is the type of cholesterol that helps protect against heart disease.  High levels of triglycerides. Triglycerides are a fatty substance in the blood linked to a buildup of plaque on your arteries. RISK FACTORS  Increased age.  Having a family history of high cholesterol.  Certain medicines, including birth control pills, diuretics, beta-blockers, and some medicines for depression.  Smoking.  Eating a high-fat diet.  Being overweight.  Medical conditions such as diabetes, polycystic ovary syndrome, pregnancy, kidney disease, and hypothyroidism.  Lack  of regular exercise. SIGNS AND SYMPTOMS There are no signs or symptoms with dyslipidemia. DIAGNOSIS A simple blood test called a fasting blood test can be done to determine your level of:  Total cholesterol. This is the combined number of LDL cholesterol and HDL cholesterol. A healthy number is lower than 200.  LDL cholesterol. The goal number for LDL cholesterol is different for each person depending on risk factors. Ask your health care provider what your LDL cholesterol number should be.  HDL cholesterol. A healthy level of HDL cholesterol is 60 or higher. A  number lower than 40 for men or 50 for women is a danger sign.  Triglycerides. A healthy triglyceride number is less than 150. TREATMENT Dyslipidemia is a treatable condition. Your health care provider will advise you on what type of treatment is best based on your age, your test results, and current guidelines. Treatment may include:  Dietary changes. A dietitian may help you create a diet that is based on your risk factors, conditions, and lifestyle.  Regular exercise. This can help lower your LDL cholesterol, raise your HDL cholesterol, and help with weight management. Check with your health care provider before beginning an exercise program. Most people should participate in 30 minutes of brisk exercise 5 days a week.  Quitting smoking.  Medicines to lower LDL cholesterol and triglycerides.  If you have high levels of triglycerides, your health care provider may:  Have you stop drinking alcohol.  Have you restrict your fat intake.  Have you eliminate refined sugars from your diet.  Treat you for other conditions, such as underactive thyroid gland (hypothyroidism) and high blood sugar (hyperglycemia). Your health care provider will monitor your lipid levels with regular blood tests. HOME CARE INSTRUCTIONS  Eat a healthy diet. Follow any diet instructions if they were given to you by your health care provider.  Maintain a healthy weight.  Exercise regularly based on the recommendations of your health care provider.  Do not use any tobacco products, including cigarettes, chewing tobacco, or electronic cigarettes.  Take medicines only as directed by your health care provider.  Keep all follow-up visits as directed by your health care provider. SEEK MEDICAL CARE IF: You are having possible side effects from your medicines.   This information is not intended to replace advice given to you by your health care provider. Make sure you discuss any questions you have with your health  care provider.   Document Released: 01/11/2013 Document Revised: 01/27/2014 Document Reviewed: 01/11/2013 Elsevier Interactive Patient Education [employer].

## 2014-12-25 ENCOUNTER — Other Ambulatory Visit: Payer: Self-pay | Admitting: Gynecology

## 2014-12-25 ENCOUNTER — Ambulatory Visit (INDEPENDENT_AMBULATORY_CARE_PROVIDER_SITE_OTHER): Payer: Commercial Managed Care - HMO

## 2014-12-25 DIAGNOSIS — Z780 Asymptomatic menopausal state: Secondary | ICD-10-CM | POA: Diagnosis not present

## 2014-12-25 DIAGNOSIS — Z13820 Encounter for screening for osteoporosis: Secondary | ICD-10-CM

## 2015-08-07 ENCOUNTER — Other Ambulatory Visit: Payer: Self-pay | Admitting: Gynecology

## 2015-08-07 DIAGNOSIS — Z1231 Encounter for screening mammogram for malignant neoplasm of breast: Secondary | ICD-10-CM

## 2015-09-13 ENCOUNTER — Ambulatory Visit
Admission: RE | Admit: 2015-09-13 | Discharge: 2015-09-13 | Disposition: A | Discharge status: Home / Self Care | Payer: Commercial Managed Care - HMO | Source: Ambulatory Visit | Attending: Gynecology | Admitting: Gynecology

## 2015-09-13 DIAGNOSIS — Z1231 Encounter for screening mammogram for malignant neoplasm of breast: Secondary | ICD-10-CM

## 2015-10-29 ENCOUNTER — Ambulatory Visit (INDEPENDENT_AMBULATORY_CARE_PROVIDER_SITE_OTHER): Payer: Commercial Managed Care - HMO | Admitting: Gynecology

## 2015-10-29 ENCOUNTER — Encounter: Payer: Self-pay | Admitting: Gynecology

## 2015-10-29 VITALS — BP 138/86 | Ht 66.5 in | Wt 167.0 lb

## 2015-10-29 DIAGNOSIS — R102 Pelvic and perineal pain: Secondary | ICD-10-CM

## 2015-10-29 DIAGNOSIS — Z01411 Encounter for gynecological examination (general) (routine) with abnormal findings: Secondary | ICD-10-CM | POA: Diagnosis not present

## 2015-10-29 MED ORDER — ESTRADIOL 10 MCG VA TABS: 1.0000 | ORAL_TABLET | VAGINAL | 11 refills | Status: DC

## 2015-10-29 NOTE — Progress Notes (Signed)
Courtney Baxter [birth year] [id number]   History:    60 y.o.  for annual gyn exam with the only complaint is of occasional on and off right lower abdominal discomfort. She has no GI or GU complaints. She has had past history of total abdominal hysterectomy with bilateral salpingo-oophorectomy. She has had issues with vaginal dryness for which she has used Vagifem 10 g twice a week which has helped. Her PCP is Dr. Dema Severin who she will be seeing the next 2 weeks we'll begin her blood work at that time who is also treating her for hyperlipidemia.Review of patient's records indicated she had a normal colonoscopy in 2009. Previous last bone density study in 2016 was normal. She is taking her calcium and vitamin D for osteoporosis prevention. Prior to her hysterectomy and after she has never had any abnormal Pap smear. She states she will cut her flu vaccine at work.  Past medical history,surgical history, family history and social history were all reviewed and documented in the EPIC chart.  Gynecologic History No LMP recorded. Patient has had a hysterectomy. Contraception: status post hysterectomy Last Pap: 2012. Results were: normal Last mammogram: 2017. Results were: normal  Obstetric History OB History  Gravida Para Term Preterm AB Living  2 2       2   SAB TAB Ectopic Multiple Live Births               # Outcome Date GA Lbr Len/2nd Weight Sex Delivery Anes PTL Lv  2 Para     F Vag-Spont     1 Para     F Vag-Spont          ROS: A ROS was performed and pertinent positives and negatives are included in the history.  GENERAL: No fevers or chills. HEENT: No change in vision, no earache, sore throat or sinus congestion. NECK: No pain or stiffness. CARDIOVASCULAR: No chest pain or pressure. No palpitations. PULMONARY: No shortness of breath, cough or wheeze. GASTROINTESTINAL: No abdominal pain, nausea, vomiting or diarrhea, melena or bright red blood per rectum. GENITOURINARY: No urinary  frequency, urgency, hesitancy or dysuria. MUSCULOSKELETAL: No joint or muscle pain, no back pain, no recent trauma. DERMATOLOGIC: No rash, no itching, no lesions. ENDOCRINE: No polyuria, polydipsia, no heat or cold intolerance. No recent change in weight. HEMATOLOGICAL: No anemia or easy bruising or bleeding. NEUROLOGIC: No headache, seizures, numbness, tingling or weakness. PSYCHIATRIC: No depression, no loss of interest in normal activity or change in sleep pattern.     Exam: chaperone present  BP 138/86   Ht 5' 6.5" (1.689 m)   Wt 167 lb (75.8 kg)   BMI 26.55 kg/m   Body mass index is 26.55 kg/m.  General appearance : Well developed well nourished female. No acute distress HEENT: Eyes: no retinal hemorrhage or exudates,  Neck supple, trachea midline, no carotid bruits, no thyroidmegaly Lungs: Clear to auscultation, no rhonchi or wheezes, or rib retractions  Heart: Regular rate and rhythm, no murmurs or gallops Breast:Examined in sitting and supine position were symmetrical in appearance, no palpable masses or tenderness,  no skin retraction, no nipple inversion, no nipple discharge, no skin discoloration, no axillary or supraclavicular lymphadenopathy Abdomen: no palpable masses or tenderness, no rebound or guarding Extremities: no edema or skin discoloration or tenderness  Pelvic:  Bartholin, Urethra, Skene Glands: Within normal limits             Vagina: No gross lesions or discharge  Cervix: Absent  Uterus  absent  Adnexa  Without masses or tenderness  Anus and perineum  normal   Rectovaginal  normal sphincter tone without palpated masses or tenderness             Hemoccult PCP will provide     Assessment/Plan:  60 y.o. female for annual exam doing well. Prescription refill for Vagifem 10 g to apply intravaginally twice a week for vaginal atrophy was provided. She will no longer need Pap smears according to new guidelines. She will need her next bone density study in 2017.  Her PCP we'll be doing her blood work.   Terrance Mass MD, 2:46 PM 10/29/2015

## 2015-10-29 NOTE — Patient Instructions (Signed)

## 2016-06-04 ENCOUNTER — Encounter: Payer: Self-pay | Admitting: Gynecology

## 2016-06-04 ENCOUNTER — Other Ambulatory Visit: Payer: Self-pay | Admitting: Family Medicine

## 2016-06-04 ENCOUNTER — Ambulatory Visit
Admission: RE | Admit: 2016-06-04 | Discharge: 2016-06-04 | Disposition: A | Discharge status: Home / Self Care | Payer: Commercial Managed Care - HMO | Source: Ambulatory Visit | Attending: Family Medicine | Admitting: Family Medicine

## 2016-06-04 DIAGNOSIS — M47816 Spondylosis without myelopathy or radiculopathy, lumbar region: Secondary | ICD-10-CM | POA: Diagnosis not present

## 2016-06-04 DIAGNOSIS — G43009 Migraine without aura, not intractable, without status migrainosus: Secondary | ICD-10-CM | POA: Diagnosis not present

## 2016-06-04 DIAGNOSIS — S335XXA Sprain of ligaments of lumbar spine, initial encounter: Secondary | ICD-10-CM

## 2016-07-17 ENCOUNTER — Other Ambulatory Visit: Payer: Self-pay | Admitting: Gynecology

## 2016-07-17 DIAGNOSIS — Z1231 Encounter for screening mammogram for malignant neoplasm of breast: Secondary | ICD-10-CM

## 2016-09-15 ENCOUNTER — Ambulatory Visit
Admission: RE | Admit: 2016-09-15 | Discharge: 2016-09-15 | Disposition: A | Discharge status: Home / Self Care | Payer: Commercial Managed Care - HMO | Source: Ambulatory Visit | Attending: Gynecology | Admitting: Gynecology

## 2016-09-15 DIAGNOSIS — Z1231 Encounter for screening mammogram for malignant neoplasm of breast: Secondary | ICD-10-CM

## 2016-10-13 DIAGNOSIS — H5203 Hypermetropia, bilateral: Secondary | ICD-10-CM | POA: Diagnosis not present

## 2016-10-29 ENCOUNTER — Encounter: Payer: Self-pay | Admitting: Obstetrics & Gynecology

## 2016-10-29 ENCOUNTER — Ambulatory Visit (INDEPENDENT_AMBULATORY_CARE_PROVIDER_SITE_OTHER): Payer: 59 | Admitting: Obstetrics & Gynecology

## 2016-10-29 VITALS — BP 132/90 | Ht 66.5 in | Wt 165.0 lb

## 2016-10-29 DIAGNOSIS — Z780 Asymptomatic menopausal state: Secondary | ICD-10-CM | POA: Diagnosis not present

## 2016-10-29 DIAGNOSIS — Z90710 Acquired absence of both cervix and uterus: Secondary | ICD-10-CM | POA: Diagnosis not present

## 2016-10-29 DIAGNOSIS — Z01411 Encounter for gynecological examination (general) (routine) with abnormal findings: Secondary | ICD-10-CM

## 2016-10-29 NOTE — Addendum Note (Signed)
Addended by: Thurnell Garbe A on: 10/29/2016 04:39 PM   Modules accepted: Orders

## 2016-10-29 NOTE — Progress Notes (Signed)
Courtney Baxter 23-May-1955 [id number]   History:    61 y.o.G2P2L2 Married.  1 Grand-daughter.  RP:  Established patient presenting  for annual gyn exam   HPI:  S/P TAH/BSO.  No pelvic pain.  Menopause.  No HRT.  Sexually active, no dyspareunia.  Breasts wnl.  Mictions/BMs wnl.  Past medical history,surgical history, family history and social history were all reviewed and documented in the EPIC chart.  Gynecologic History No LMP recorded. Patient has had a hysterectomy. Contraception: status post hysterectomy Last Pap: 2012. Results were: normal Last mammogram: 08/2016. Results were: Negative Dexa 2016 [place] 2009, 10 yr schedule  Obstetric History OB History  Gravida Para Term Preterm AB Living  '2 2       2  ' SAB TAB Ectopic Multiple Live Births               # Outcome Date GA Lbr Len/2nd Weight Sex Delivery Anes PTL Lv  2 Para     F Vag-Spont     1 Para     F Vag-Spont          ROS: A ROS was performed and pertinent positives and negatives are included in the history.  GENERAL: No fevers or chills. HEENT: No change in vision, no earache, sore throat or sinus congestion. NECK: No pain or stiffness. CARDIOVASCULAR: No chest pain or pressure. No palpitations. PULMONARY: No shortness of breath, cough or wheeze. GASTROINTESTINAL: No abdominal pain, nausea, vomiting or diarrhea, melena or bright red blood per rectum. GENITOURINARY: No urinary frequency, urgency, hesitancy or dysuria. MUSCULOSKELETAL: No joint or muscle pain, no back pain, no recent trauma. DERMATOLOGIC: No rash, no itching, no lesions. ENDOCRINE: No polyuria, polydipsia, no heat or cold intolerance. No recent change in weight. HEMATOLOGICAL: No anemia or easy bruising or bleeding. NEUROLOGIC: No headache, seizures, numbness, tingling or weakness. PSYCHIATRIC: No depression, no loss of interest in normal activity or change in sleep pattern.     Exam:   BP 132/90   Ht 5' 6.5" (1.689 m)   Wt 165 lb (74.8 kg)    BMI 26.23 kg/m   Body mass index is 26.23 kg/m.  General appearance : Well developed well nourished female. No acute distress HEENT: Eyes: no retinal hemorrhage or exudates,  Neck supple, trachea midline, no carotid bruits, no thyroidmegaly Lungs: Clear to auscultation, no rhonchi or wheezes, or rib retractions  Heart: Regular rate and rhythm, no murmurs or gallops Breast:Examined in sitting and supine position were symmetrical in appearance, no palpable masses or tenderness,  no skin retraction, no nipple inversion, no nipple discharge, no skin discoloration, no axillary or supraclavicular lymphadenopathy Abdomen: no palpable masses or tenderness, no rebound or guarding Extremities: no edema or skin discoloration or tenderness  Pelvic: Vulva normal  Bartholin, Urethra, Skene Glands: Within normal limits             Vagina: No gross lesions or discharge.  Pap reflex done.  Cervix/Uterus absent  Adnexa  Without masses or tenderness  Anus and perineum  normal    Assessment/Plan:  61 y.o. female for annual exam   1. Encounter for gynecological examination with abnormal finding Normal gyn exam s/p TAH/BSO.  Pap reflex done.  Breasts wnl.  Recent mammo neg. - CBC; Future - Comp Met (CMET); Future - Vitamin D 1,25 dihydroxy; Future - Lipid Profile; Future - TSH; Future  2. H/O abdominal hysterectomy  3. Menopause present No HRT.  Asymptomatic.  Recommend Vit D supplements/Ca++ in  nutrition/Weight bearing physical activity.  Repeat Bone Density, schedule here.  Princess Bruins MD, 2:48 PM 10/29/2016

## 2016-10-29 NOTE — Patient Instructions (Signed)
1. Encounter for gynecological examination with abnormal finding Normal gyn exam s/p TAH/BSO.  Pap reflex done.  Breasts wnl.  Recent mammo neg. - CBC; Future - Comp Met (CMET); Future - Vitamin D 1,25 dihydroxy; Future - Lipid Profile; Future - TSH; Future  2. H/O abdominal hysterectomy  3. Menopause present No HRT.  Asymptomatic.  Recommend Vit D supplements/Ca++ in nutrition/Weight bearing physical activity.  Repeat Bone Density, schedule here.  Courtney Baxter, it was a pleasure to meet you today!  I will inform you of your results as soon as available.   Health Maintenance for Postmenopausal Women Menopause is a normal process in which your reproductive ability comes to an end. This process happens gradually over a span of months to years, usually between the ages of 48 and 55. Menopause is complete when you have missed 12 consecutive menstrual periods. It is important to talk with your health care provider about some of the most common conditions that affect postmenopausal women, such as heart disease, cancer, and bone loss (osteoporosis). Adopting a healthy lifestyle and getting preventive care can help to promote your health and wellness. Those actions can also lower your chances of developing some of these common conditions. What should I know about menopause? During menopause, you may experience a number of symptoms, such as:  Moderate-to-severe hot flashes.  Night sweats.  Decrease in sex drive.  Mood swings.  Headaches.  Tiredness.  Irritability.  Memory problems.  Insomnia.  Choosing to treat or not to treat menopausal changes is an individual decision that you make with your health care provider. What should I know about hormone replacement therapy and supplements? Hormone therapy products are effective for treating symptoms that are associated with menopause, such as hot flashes and night sweats. Hormone replacement carries certain risks, especially as you become  older. If you are thinking about using estrogen or estrogen with progestin treatments, discuss the benefits and risks with your health care provider. What should I know about heart disease and stroke? Heart disease, heart attack, and stroke become more likely as you age. This may be due, in part, to the hormonal changes that your body experiences during menopause. These can affect how your body processes dietary fats, triglycerides, and cholesterol. Heart attack and stroke are both medical emergencies. There are many things that you can do to help prevent heart disease and stroke:  Have your blood pressure checked at least every 1-2 years. High blood pressure causes heart disease and increases the risk of stroke.  If you are 55-79 years old, ask your health care provider if you should take aspirin to prevent a heart attack or a stroke.  Do not use any tobacco products, including cigarettes, chewing tobacco, or electronic cigarettes. If you need help quitting, ask your health care provider.  It is important to eat a healthy diet and maintain a healthy weight. ? Be sure to include plenty of vegetables, fruits, low-fat dairy products, and lean protein. ? Avoid eating foods that are high in solid fats, added sugars, or salt (sodium).  Get regular exercise. This is one of the most important things that you can do for your health. ? Try to exercise for at least 150 minutes each week. The type of exercise that you do should increase your heart rate and make you sweat. This is known as moderate-intensity exercise. ? Try to do strengthening exercises at least twice each week. Do these in addition to the moderate-intensity exercise.  Know your numbers.Ask your health   care provider to check your cholesterol and your blood glucose. Continue to have your blood tested as directed by your health care provider.  What should I know about cancer screening? There are several types of cancer. Take the following  steps to reduce your risk and to catch any cancer development as early as possible. Breast Cancer  Practice breast self-awareness. ? This means understanding how your breasts normally appear and feel. ? It also means doing regular breast self-exams. Let your health care provider know about any changes, no matter how small.  If you are 40 or older, have a clinician do a breast exam (clinical breast exam or CBE) every year. Depending on your age, family history, and medical history, it may be recommended that you also have a yearly breast X-ray (mammogram).  If you have a family history of breast cancer, talk with your health care provider about genetic screening.  If you are at high risk for breast cancer, talk with your health care provider about having an MRI and a mammogram every year.  Breast cancer (BRCA) gene test is recommended for women who have family members with BRCA-related cancers. Results of the assessment will determine the need for genetic counseling and BRCA1 and for BRCA2 testing. BRCA-related cancers include these types: ? Breast. This occurs in males or females. ? Ovarian. ? Tubal. This may also be called fallopian tube cancer. ? Cancer of the abdominal or pelvic lining (peritoneal cancer). ? Prostate. ? Pancreatic.  Cervical, Uterine, and Ovarian Cancer Your health care provider may recommend that you be screened regularly for cancer of the pelvic organs. These include your ovaries, uterus, and vagina. This screening involves a pelvic exam, which includes checking for microscopic changes to the surface of your cervix (Pap test).  For women ages 21-65, health care providers may recommend a pelvic exam and a Pap test every three years. For women ages 30-65, they may recommend the Pap test and pelvic exam, combined with testing for human papilloma virus (HPV), every five years. Some types of HPV increase your risk of cervical cancer. Testing for HPV may also be done on women  of any age who have unclear Pap test results.  Other health care providers may not recommend any screening for nonpregnant women who are considered low risk for pelvic cancer and have no symptoms. Ask your health care provider if a screening pelvic exam is right for you.  If you have had past treatment for cervical cancer or a condition that could lead to cancer, you need Pap tests and screening for cancer for at least 20 years after your treatment. If Pap tests have been discontinued for you, your risk factors (such as having a new sexual partner) need to be reassessed to determine if you should start having screenings again. Some women have medical problems that increase the chance of getting cervical cancer. In these cases, your health care provider may recommend that you have screening and Pap tests more often.  If you have a family history of uterine cancer or ovarian cancer, talk with your health care provider about genetic screening.  If you have vaginal bleeding after reaching menopause, tell your health care provider.  There are currently no reliable tests available to screen for ovarian cancer.  Lung Cancer Lung cancer screening is recommended for adults 55-80 years old who are at high risk for lung cancer because of a history of smoking. A yearly low-dose CT scan of the lungs is recommended if you:    Currently smoke.  Have a history of at least 30 pack-years of smoking and you currently smoke or have quit within the past 15 years. A pack-year is smoking an average of one pack of cigarettes per day for one year.  Yearly screening should:  Continue until it has been 15 years since you quit.  Stop if you develop a health problem that would prevent you from having lung cancer treatment.  Colorectal Cancer  This type of cancer can be detected and can often be prevented.  Routine colorectal cancer screening usually begins at age 50 and continues through age 75.  If you have risk  factors for colon cancer, your health care provider may recommend that you be screened at an earlier age.  If you have a family history of colorectal cancer, talk with your health care provider about genetic screening.  Your health care provider may also recommend using home test kits to check for hidden blood in your stool.  A small camera at the end of a tube can be used to examine your colon directly (sigmoidoscopy or colonoscopy). This is done to check for the earliest forms of colorectal cancer.  Direct examination of the colon should be repeated every 5-10 years until age 75. However, if early forms of precancerous polyps or small growths are found or if you have a family history or genetic risk for colorectal cancer, you may need to be screened more often.  Skin Cancer  Check your skin from head to toe regularly.  Monitor any moles. Be sure to tell your health care provider: ? About any new moles or changes in moles, especially if there is a change in a mole's shape or color. ? If you have a mole that is larger than the size of a pencil eraser.  If any of your family members has a history of skin cancer, especially at a young age, talk with your health care provider about genetic screening.  Always use sunscreen. Apply sunscreen liberally and repeatedly throughout the day.  Whenever you are outside, protect yourself by wearing long sleeves, pants, a wide-brimmed hat, and sunglasses.  What should I know about osteoporosis? Osteoporosis is a condition in which bone destruction happens more quickly than new bone creation. After menopause, you may be at an increased risk for osteoporosis. To help prevent osteoporosis or the bone fractures that can happen because of osteoporosis, the following is recommended:  If you are 19-50 years old, get at least 1,000 mg of calcium and at least 600 mg of vitamin D per day.  If you are older than age 50 but younger than age 70, get at least 1,200  mg of calcium and at least 600 mg of vitamin D per day.  If you are older than age 70, get at least 1,200 mg of calcium and at least 800 mg of vitamin D per day.  Smoking and excessive alcohol intake increase the risk of osteoporosis. Eat foods that are rich in calcium and vitamin D, and do weight-bearing exercises several times each week as directed by your health care provider. What should I know about how menopause affects my mental health? Depression may occur at any age, but it is more common as you become older. Common symptoms of depression include:  Low or sad mood.  Changes in sleep patterns.  Changes in appetite or eating patterns.  Feeling an overall lack of motivation or enjoyment of activities that you previously enjoyed.  Frequent crying spells.    Talk with your health care provider if you think that you are experiencing depression. What should I know about immunizations? It is important that you get and maintain your immunizations. These include:  Tetanus, diphtheria, and pertussis (Tdap) booster vaccine.  Influenza every year before the flu season begins.  Pneumonia vaccine.  Shingles vaccine.  Your health care provider may also recommend other immunizations. This information is not intended to replace advice given to you by your health care provider. Make sure you discuss any questions you have with your health care provider. Document Released: 02/28/2005 Document Revised: 07/27/2015 Document Reviewed: 10/10/2014 Elsevier Interactive Patient Education  2018 [employer].

## 2016-10-30 ENCOUNTER — Other Ambulatory Visit: Payer: 59

## 2016-10-30 DIAGNOSIS — Z01411 Encounter for gynecological examination (general) (routine) with abnormal findings: Secondary | ICD-10-CM

## 2016-10-30 LAB — PAP IG W/ RFLX HPV ASCU

## 2016-11-02 LAB — LIPID PANEL
CHOL/HDL RATIO: 4.6 (calc) (ref <5.0)
CHOLESTEROL: 242 mg/dL — AB (ref <200)
HDL: 53 mg/dL (ref >50)
LDL Cholesterol (Calc): 159 mg/dL (calc) — ABNORMAL HIGH
NON-HDL CHOLESTEROL (CALC): 189 mg/dL — AB (ref <130)
Triglycerides: 161 mg/dL — ABNORMAL HIGH (ref <150)

## 2016-11-02 LAB — COMPREHENSIVE METABOLIC PANEL
AG RATIO: 1.4 (calc) (ref 1.0–2.5)
ALBUMIN MSPROF: 4.1 g/dL (ref 3.6–5.1)
ALKALINE PHOSPHATASE (APISO): 85 U/L (ref 33–130)
ALT: 9 U/L (ref 6–29)
AST: 12 U/L (ref 10–35)
BUN: 17 mg/dL (ref 7–25)
CHLORIDE: 104 mmol/L (ref 98–110)
CO2: 27 mmol/L (ref 20–32)
CREATININE: 0.98 mg/dL (ref 0.50–0.99)
Calcium: 9.3 mg/dL (ref 8.6–10.4)
Globulin: 2.9 g/dL (calc) (ref 1.9–3.7)
Glucose, Bld: 107 mg/dL — ABNORMAL HIGH (ref 65–99)
Potassium: 4.2 mmol/L (ref 3.5–5.3)
Sodium: 140 mmol/L (ref 135–146)
Total Bilirubin: 0.4 mg/dL (ref 0.2–1.2)
Total Protein: 7 g/dL (ref 6.1–8.1)

## 2016-11-02 LAB — VITAMIN D 1,25 DIHYDROXY
VITAMIN D3 1, 25 (OH): 19 pg/mL
Vitamin D 1, 25 (OH)2 Total: 19 pg/mL (ref 18–72)
Vitamin D2 1, 25 (OH)2: <8 pg/mL

## 2016-11-02 LAB — CBC
HCT: 36.7 % (ref 35.0–45.0)
HEMOGLOBIN: 12.4 g/dL (ref 11.7–15.5)
MCH: 28.8 pg (ref 27.0–33.0)
MCHC: 33.8 g/dL (ref 32.0–36.0)
MCV: 85.3 fL (ref 80.0–100.0)
MPV: 9.4 fL (ref 7.5–12.5)
PLATELETS: 412 10*3/uL — AB (ref 140–400)
RBC: 4.3 10*6/uL (ref 3.80–5.10)
RDW: 12.4 % (ref 11.0–15.0)
WBC: 6 10*3/uL (ref 3.8–10.8)

## 2016-11-02 LAB — TSH: TSH: 1.59 mIU/L (ref 0.40–4.50)

## 2016-11-14 ENCOUNTER — Telehealth: Payer: Self-pay

## 2016-11-14 NOTE — Telephone Encounter (Signed)
Patient advised about VIT D being low. She said Dr. Moshe Salisbury has had her taking 2000 units Vit D3 OTC for awhile and she takes is consistently everyday.  What to rec?

## 2016-11-14 NOTE — Telephone Encounter (Signed)
-----   Message from Princess Bruins, MD sent at 11/04/2016 11:23 AM EDT ----- Yes, recommend increasing Vit D supplement.  If not on any, can do Vit D 800 every day and retest in 3 months.  If already on supplements at that level, please let me know and I'll prescribe a higher dosage. ----- Message ----- From: Ramond Craver, RMA Sent: 11/04/2016  11:16 AM To: Princess Bruins, MD  Did you want me to advise her about Vitamin D level at 64?

## 2016-11-15 NOTE — Telephone Encounter (Signed)
Please prescribe 50 000 IU per week for 12 weeks and repeat Vit D level in 12 weeks.

## 2016-11-17 ENCOUNTER — Other Ambulatory Visit: Payer: Self-pay | Admitting: Obstetrics & Gynecology

## 2016-11-17 DIAGNOSIS — E559 Vitamin D deficiency, unspecified: Secondary | ICD-10-CM

## 2016-11-17 MED ORDER — VITAMIN D (ERGOCALCIFEROL) 1.25 MG (50000 UNIT) PO CAPS: 50000.0000 [IU] | ORAL_CAPSULE | ORAL | 0 refills | Status: DC

## 2016-11-17 NOTE — Telephone Encounter (Signed)
Left patient a detailed message in her voice mail explaining recommendation. Rx sent. Lab order and recall placed. Asked patient to please call me back and let me know she received my message and understood it.

## 2016-11-25 NOTE — Telephone Encounter (Signed)
Called patient and spoke with her and confirmed she received my message and understood.

## 2017-02-09 ENCOUNTER — Other Ambulatory Visit: Payer: Self-pay | Admitting: Obstetrics & Gynecology

## 2017-02-20 ENCOUNTER — Other Ambulatory Visit: Payer: 59

## 2017-02-20 DIAGNOSIS — E559 Vitamin D deficiency, unspecified: Secondary | ICD-10-CM | POA: Diagnosis not present

## 2017-02-21 LAB — VITAMIN D 25 HYDROXY (VIT D DEFICIENCY, FRACTURES): Vit D, 25-Hydroxy: 31 ng/mL (ref 30–100)

## 2017-05-04 ENCOUNTER — Telehealth: Payer: Self-pay | Admitting: *Deleted

## 2017-05-04 NOTE — Telephone Encounter (Signed)
Pt called c/o lower back discomfort, asked if could be relayed to vitamin d 1,000 units. Pt has desk job, I explained I doubt it could be related to vitamin d otc and best to follow up with PCP,pt has appointment scheduled.

## 2017-05-05 DIAGNOSIS — M542 Cervicalgia: Secondary | ICD-10-CM | POA: Diagnosis not present

## 2017-05-05 DIAGNOSIS — R51 Headache: Secondary | ICD-10-CM | POA: Diagnosis not present

## 2017-05-05 DIAGNOSIS — M545 Low back pain: Secondary | ICD-10-CM | POA: Diagnosis not present

## 2017-08-10 ENCOUNTER — Other Ambulatory Visit: Payer: Self-pay | Admitting: Obstetrics & Gynecology

## 2017-08-10 DIAGNOSIS — Z1231 Encounter for screening mammogram for malignant neoplasm of breast: Secondary | ICD-10-CM

## 2017-09-16 ENCOUNTER — Ambulatory Visit
Admission: RE | Admit: 2017-09-16 | Discharge: 2017-09-16 | Disposition: A | Discharge status: Home / Self Care | Payer: Commercial Managed Care - HMO | Source: Ambulatory Visit | Attending: Obstetrics & Gynecology | Admitting: Obstetrics & Gynecology

## 2017-09-16 DIAGNOSIS — Z1231 Encounter for screening mammogram for malignant neoplasm of breast: Secondary | ICD-10-CM

## 2017-10-01 DIAGNOSIS — Z0000 Encounter for general adult medical examination without abnormal findings: Secondary | ICD-10-CM | POA: Diagnosis not present

## 2017-10-01 DIAGNOSIS — E782 Mixed hyperlipidemia: Secondary | ICD-10-CM | POA: Diagnosis not present

## 2017-11-02 ENCOUNTER — Ambulatory Visit (INDEPENDENT_AMBULATORY_CARE_PROVIDER_SITE_OTHER): Payer: 59 | Admitting: Women's Health

## 2017-11-02 ENCOUNTER — Encounter: Payer: Self-pay | Admitting: Women's Health

## 2017-11-02 ENCOUNTER — Encounter: Payer: 59 | Admitting: Women's Health

## 2017-11-02 VITALS — BP 120/78 | Ht 66.0 in | Wt 166.0 lb

## 2017-11-02 DIAGNOSIS — Z13220 Encounter for screening for lipoid disorders: Secondary | ICD-10-CM | POA: Diagnosis not present

## 2017-11-02 DIAGNOSIS — Z01419 Encounter for gynecological examination (general) (routine) without abnormal findings: Secondary | ICD-10-CM | POA: Diagnosis not present

## 2017-11-02 LAB — CBC WITH DIFFERENTIAL/PLATELET
BASOS PCT: 0.6 %
Basophils Absolute: 49 cells/uL (ref 0–200)
EOS PCT: 1.6 %
Eosinophils Absolute: 131 cells/uL (ref 15–500)
HEMATOCRIT: 35.9 % (ref 35.0–45.0)
HEMOGLOBIN: 12 g/dL (ref 11.7–15.5)
LYMPHS ABS: 2362 {cells}/uL (ref 850–3900)
MCH: 28.2 pg (ref 27.0–33.0)
MCHC: 33.4 g/dL (ref 32.0–36.0)
MCV: 84.5 fL (ref 80.0–100.0)
MPV: 9.9 fL (ref 7.5–12.5)
Monocytes Relative: 5.9 %
NEUTROS ABS: 5174 {cells}/uL (ref 1500–7800)
Neutrophils Relative %: 63.1 %
Platelets: 394 10*3/uL (ref 140–400)
RBC: 4.25 10*6/uL (ref 3.80–5.10)
RDW: 12.9 % (ref 11.0–15.0)
Total Lymphocyte: 28.8 %
WBC: 8.2 10*3/uL (ref 3.8–10.8)
WBCMIX: 484 {cells}/uL (ref 200–950)

## 2017-11-02 LAB — COMPREHENSIVE METABOLIC PANEL
AG Ratio: 1.4 (calc) (ref 1.0–2.5)
ALBUMIN MSPROF: 4 g/dL (ref 3.6–5.1)
ALT: 8 U/L (ref 6–29)
AST: 12 U/L (ref 10–35)
Alkaline phosphatase (APISO): 86 U/L (ref 33–130)
BILIRUBIN TOTAL: 0.3 mg/dL (ref 0.2–1.2)
BUN/Creatinine Ratio: 16 (calc) (ref 6–22)
BUN: 16 mg/dL (ref 7–25)
CO2: 26 mmol/L (ref 20–32)
CREATININE: 1.01 mg/dL — AB (ref 0.50–0.99)
Calcium: 9.7 mg/dL (ref 8.6–10.4)
Chloride: 103 mmol/L (ref 98–110)
Globulin: 2.9 g/dL (calc) (ref 1.9–3.7)
Glucose, Bld: 97 mg/dL (ref 65–99)
POTASSIUM: 3.8 mmol/L (ref 3.5–5.3)
SODIUM: 140 mmol/L (ref 135–146)
TOTAL PROTEIN: 6.9 g/dL (ref 6.1–8.1)

## 2017-11-02 LAB — LIPID PANEL
CHOL/HDL RATIO: 4.8 (calc) (ref <5.0)
Cholesterol: 232 mg/dL — ABNORMAL HIGH (ref <200)
HDL: 48 mg/dL — ABNORMAL LOW (ref >50)
LDL CHOLESTEROL (CALC): 145 mg/dL — AB
NON-HDL CHOLESTEROL (CALC): 184 mg/dL — AB (ref <130)
TRIGLYCERIDES: 239 mg/dL — AB (ref <150)

## 2017-11-02 NOTE — Patient Instructions (Signed)
Health Maintenance for Postmenopausal Women Menopause is a normal process in which your reproductive ability comes to an end. This process happens gradually over a span of months to years, usually between the ages of 22 and 9. Menopause is complete when you have missed 12 consecutive menstrual periods. It is important to talk with your health care provider about some of the most common conditions that affect postmenopausal women, such as heart disease, cancer, and bone loss (osteoporosis). Adopting a healthy lifestyle and getting preventive care can help to promote your health and wellness. Those actions can also lower your chances of developing some of these common conditions. What should I know about menopause? During menopause, you may experience a number of symptoms, such as:  Moderate-to-severe hot flashes.  Night sweats.  Decrease in sex drive.  Mood swings.  Headaches.  Tiredness.  Irritability.  Memory problems.  Insomnia.  Choosing to treat or not to treat menopausal changes is an individual decision that you make with your health care provider. What should I know about hormone replacement therapy and supplements? Hormone therapy products are effective for treating symptoms that are associated with menopause, such as hot flashes and night sweats. Hormone replacement carries certain risks, especially as you become older. If you are thinking about using estrogen or estrogen with progestin treatments, discuss the benefits and risks with your health care provider. What should I know about heart disease and stroke? Heart disease, heart attack, and stroke become more likely as you age. This may be due, in part, to the hormonal changes that your body experiences during menopause. These can affect how your body processes dietary fats, triglycerides, and cholesterol. Heart attack and stroke are both medical emergencies. There are many things that you can do to help prevent heart disease  and stroke:  Have your blood pressure checked at least every 1-2 years. High blood pressure causes heart disease and increases the risk of stroke.  If you are 53-22 years old, ask your health care provider if you should take aspirin to prevent a heart attack or a stroke.  Do not use any tobacco products, including cigarettes, chewing tobacco, or electronic cigarettes. If you need help quitting, ask your health care provider.  It is important to eat a healthy diet and maintain a healthy weight. ? Be sure to include plenty of vegetables, fruits, low-fat dairy products, and lean protein. ? Avoid eating foods that are high in solid fats, added sugars, or salt (sodium).  Get regular exercise. This is one of the most important things that you can do for your health. ? Try to exercise for at least 150 minutes each week. The type of exercise that you do should increase your heart rate and make you sweat. This is known as moderate-intensity exercise. ? Try to do strengthening exercises at least twice each week. Do these in addition to the moderate-intensity exercise.  Know your numbers.Ask your health care provider to check your cholesterol and your blood glucose. Continue to have your blood tested as directed by your health care provider.  What should I know about cancer screening? There are several types of cancer. Take the following steps to reduce your risk and to catch any cancer development as early as possible. Breast Cancer  Practice breast self-awareness. ? This means understanding how your breasts normally appear and feel. ? It also means doing regular breast self-exams. Let your health care provider know about any changes, no matter how small.  If you are 40  or older, have a clinician do a breast exam (clinical breast exam or CBE) every year. Depending on your age, family history, and medical history, it may be recommended that you also have a yearly breast X-ray (mammogram).  If you  have a family history of breast cancer, talk with your health care provider about genetic screening.  If you are at high risk for breast cancer, talk with your health care provider about having an MRI and a mammogram every year.  Breast cancer (BRCA) gene test is recommended for women who have family members with BRCA-related cancers. Results of the assessment will determine the need for genetic counseling and BRCA1 and for BRCA2 testing. BRCA-related cancers include these types: ? Breast. This occurs in males or females. ? Ovarian. ? Tubal. This may also be called fallopian tube cancer. ? Cancer of the abdominal or pelvic lining (peritoneal cancer). ? Prostate. ? Pancreatic.  Cervical, Uterine, and Ovarian Cancer Your health care provider may recommend that you be screened regularly for cancer of the pelvic organs. These include your ovaries, uterus, and vagina. This screening involves a pelvic exam, which includes checking for microscopic changes to the surface of your cervix (Pap test).  For women ages 21-65, health care providers may recommend a pelvic exam and a Pap test every three years. For women ages 79-65, they may recommend the Pap test and pelvic exam, combined with testing for human papilloma virus (HPV), every five years. Some types of HPV increase your risk of cervical cancer. Testing for HPV may also be done on women of any age who have unclear Pap test results.  Other health care providers may not recommend any screening for nonpregnant women who are considered low risk for pelvic cancer and have no symptoms. Ask your health care provider if a screening pelvic exam is right for you.  If you have had past treatment for cervical cancer or a condition that could lead to cancer, you need Pap tests and screening for cancer for at least 20 years after your treatment. If Pap tests have been discontinued for you, your risk factors (such as having a new sexual partner) need to be  reassessed to determine if you should start having screenings again. Some women have medical problems that increase the chance of getting cervical cancer. In these cases, your health care provider may recommend that you have screening and Pap tests more often.  If you have a family history of uterine cancer or ovarian cancer, talk with your health care provider about genetic screening.  If you have vaginal bleeding after reaching menopause, tell your health care provider.  There are currently no reliable tests available to screen for ovarian cancer.  Lung Cancer Lung cancer screening is recommended for adults 69-62 years old who are at high risk for lung cancer because of a history of smoking. A yearly low-dose CT scan of the lungs is recommended if you:  Currently smoke.  Have a history of at least 30 pack-years of smoking and you currently smoke or have quit within the past 15 years. A pack-year is smoking an average of one pack of cigarettes per day for one year.  Yearly screening should:  Continue until it has been 15 years since you quit.  Stop if you develop a health problem that would prevent you from having lung cancer treatment.  Colorectal Cancer  This type of cancer can be detected and can often be prevented.  Routine colorectal cancer screening usually begins at  age 42 and continues through age 45.  If you have risk factors for colon cancer, your health care provider may recommend that you be screened at an earlier age.  If you have a family history of colorectal cancer, talk with your health care provider about genetic screening.  Your health care provider may also recommend using home test kits to check for hidden blood in your stool.  A small camera at the end of a tube can be used to examine your colon directly (sigmoidoscopy or colonoscopy). This is done to check for the earliest forms of colorectal cancer.  Direct examination of the colon should be repeated every  5-10 years until age 71. However, if early forms of precancerous polyps or small growths are found or if you have a family history or genetic risk for colorectal cancer, you may need to be screened more often.  Skin Cancer  Check your skin from head to toe regularly.  Monitor any moles. Be sure to tell your health care provider: ? About any new moles or changes in moles, especially if there is a change in a mole's shape or color. ? If you have a mole that is larger than the size of a pencil eraser.  If any of your family members has a history of skin cancer, especially at a Kamera Dubas age, talk with your health care provider about genetic screening.  Always use sunscreen. Apply sunscreen liberally and repeatedly throughout the day.  Whenever you are outside, protect yourself by wearing long sleeves, pants, a wide-brimmed hat, and sunglasses.  What should I know about osteoporosis? Osteoporosis is a condition in which bone destruction happens more quickly than new bone creation. After menopause, you may be at an increased risk for osteoporosis. To help prevent osteoporosis or the bone fractures that can happen because of osteoporosis, the following is recommended:  If you are 46-71 years old, get at least 1,000 mg of calcium and at least 600 mg of vitamin D per day.  If you are older than age 55 but younger than age 65, get at least 1,200 mg of calcium and at least 600 mg of vitamin D per day.  If you are older than age 54, get at least 1,200 mg of calcium and at least 800 mg of vitamin D per day.  Smoking and excessive alcohol intake increase the risk of osteoporosis. Eat foods that are rich in calcium and vitamin D, and do weight-bearing exercises several times each week as directed by your health care provider. What should I know about how menopause affects my mental health? Depression may occur at any age, but it is more common as you become older. Common symptoms of depression  include:  Low or sad mood.  Changes in sleep patterns.  Changes in appetite or eating patterns.  Feeling an overall lack of motivation or enjoyment of activities that you previously enjoyed.  Frequent crying spells.  Talk with your health care provider if you think that you are experiencing depression. What should I know about immunizations? It is important that you get and maintain your immunizations. These include:  Tetanus, diphtheria, and pertussis (Tdap) booster vaccine.  Influenza every year before the flu season begins.  Pneumonia vaccine.  Shingles vaccine.  Your health care provider may also recommend other immunizations. This information is not intended to replace advice given to you by your health care provider. Make sure you discuss any questions you have with your health care provider. Document Released: 02/28/2005  Document Revised: 07/27/2015 Document Reviewed: 10/10/2014 Elsevier Interactive Patient Education  2018 [employer]  

## 2017-11-02 NOTE — Progress Notes (Signed)
Courtney Baxter December 04, 1955 [id number]    History:    Presents for annual exam.  2008 TAH with BSO for fibroids no HRT.  Normal Pap and mammogram history.  2009- colonoscopy.  2016 normal DEXA.  Past medical history, past surgical history, family history and social history were all reviewed and documented in the EPIC chart.  Works for the city of [place].  2 children.  Parents deceased unclear history, father heart disease, questions cancer in mother, when patient was Courtney Baxter.  4 sisters 3 healthy, one with diabetes.  Recently returned from a two-week [place] cruise.  ROS:  A ROS was performed and pertinent positives and negatives are included.  Exam:  Vitals:   11/02/17 1431  Weight: 166 lb (75.3 kg)  Height: 5\' 6"  (7.628 m)   Body mass index is 26.79 kg/m.   General appearance:  Normal Thyroid:  Symmetrical, normal in size, without palpable masses or nodularity. Respiratory  Auscultation:  Clear without wheezing or rhonchi Cardiovascular  Auscultation:  Regular rate, without rubs, murmurs or gallops  Edema/varicosities:  Not grossly evident Abdominal  Soft,nontender, without masses, guarding or rebound.  Liver/spleen:  No organomegaly noted  Hernia:  None appreciated  Skin  Inspection:  Grossly normal   Breasts: Examined lying and sitting.     Right: Without masses, retractions, discharge or axillary adenopathy.     Left: Without masses, retractions, discharge or axillary adenopathy. Gentitourinary   Inguinal/mons:  Normal without inguinal adenopathy  External genitalia:  Normal  BUS/Urethra/Skene's glands:  Normal  Vagina: mild Atrophy  Cervix: Uterus absent adnexa/parametria:     Rt: Without masses or tenderness.   Lt: Without masses or tenderness.  Anus and perineum: Normal  Digital rectal exam: Normal sphincter tone without palpated masses or tenderness  Assessment/Plan:  62 y.o. MBF G2, P2 for annual exam with no complaints.  2008 TAH with BSO for fibroids  on no HRT  Plan: Instructed to schedule repeat screening colonoscopy, Dr. Collene Baxter.  SBE's, continue annual screening mammogram, calcium rich foods, vitamin D 2000 daily encouraged.  Continue active lifestyle of regular exercise, home safety fall prevention discussed.  Mild vaginal atrophy over-the-counter lubricants encouraged.  CBC, CMP, lipid panel, Pap guidelines reviewed.   Courtney Baxter, 2:31 PM 11/02/2017

## 2017-11-23 ENCOUNTER — Encounter: Payer: 59 | Admitting: Obstetrics & Gynecology

## 2017-12-09 ENCOUNTER — Telehealth: Payer: Self-pay

## 2017-12-09 NOTE — Telephone Encounter (Signed)
I called and left message to call me. Patient has not read My Chart message sent 11/03/17 about test results. I asked her to call to review.  "Elon Alas, NP reviewed your lab results and wrote: "CBC, blood sugar and electrolytes are all normal. Creatinine (kidney function) is slightly elevated but not a problem just make sure drinking plenty of water. Lipid panel is elevated. Triglycerides are twice what they should be. Recommend 30 minutes of regular exercise most days of the week, less than 20 g saturated fat in your diet daily. Print out results and follow-up with primary care for possible cholesterol medication."

## 2017-12-09 NOTE — Telephone Encounter (Signed)
Spoke with patient and reviewed this message/results with her.

## 2018-01-06 NOTE — Telephone Encounter (Signed)
Spoke with patient and reviewed this message with her on 12/09/17.

## 2018-02-18 ENCOUNTER — Ambulatory Visit
Admission: RE | Admit: 2018-02-18 | Discharge: 2018-02-18 | Disposition: A | Discharge status: Home / Self Care | Payer: 59 | Source: Ambulatory Visit | Attending: Family Medicine | Admitting: Family Medicine

## 2018-02-18 ENCOUNTER — Other Ambulatory Visit: Payer: Self-pay | Admitting: Family Medicine

## 2018-02-18 DIAGNOSIS — R0789 Other chest pain: Secondary | ICD-10-CM | POA: Diagnosis not present

## 2018-02-18 DIAGNOSIS — R0781 Pleurodynia: Secondary | ICD-10-CM | POA: Diagnosis not present

## 2018-08-17 ENCOUNTER — Other Ambulatory Visit: Payer: Self-pay | Admitting: Obstetrics & Gynecology

## 2018-08-17 DIAGNOSIS — Z1231 Encounter for screening mammogram for malignant neoplasm of breast: Secondary | ICD-10-CM

## 2018-09-30 ENCOUNTER — Other Ambulatory Visit: Payer: Self-pay

## 2018-09-30 ENCOUNTER — Ambulatory Visit
Admission: RE | Admit: 2018-09-30 | Discharge: 2018-09-30 | Disposition: A | Discharge status: Home / Self Care | Payer: 59 | Source: Ambulatory Visit | Attending: Obstetrics & Gynecology | Admitting: Obstetrics & Gynecology

## 2018-09-30 DIAGNOSIS — Z1231 Encounter for screening mammogram for malignant neoplasm of breast: Secondary | ICD-10-CM

## 2018-11-02 ENCOUNTER — Other Ambulatory Visit: Payer: Self-pay

## 2018-11-04 ENCOUNTER — Ambulatory Visit (INDEPENDENT_AMBULATORY_CARE_PROVIDER_SITE_OTHER): Payer: 59 | Admitting: Obstetrics & Gynecology

## 2018-11-04 ENCOUNTER — Other Ambulatory Visit: Payer: Self-pay

## 2018-11-04 ENCOUNTER — Encounter: Payer: Self-pay | Admitting: Obstetrics & Gynecology

## 2018-11-04 VITALS — BP 126/80 | Ht 66.0 in | Wt 160.0 lb

## 2018-11-04 DIAGNOSIS — Z90710 Acquired absence of both cervix and uterus: Secondary | ICD-10-CM

## 2018-11-04 DIAGNOSIS — Z780 Asymptomatic menopausal state: Secondary | ICD-10-CM

## 2018-11-04 DIAGNOSIS — Z01419 Encounter for gynecological examination (general) (routine) without abnormal findings: Secondary | ICD-10-CM

## 2018-11-04 DIAGNOSIS — Z9079 Acquired absence of other genital organ(s): Secondary | ICD-10-CM

## 2018-11-04 DIAGNOSIS — Z90722 Acquired absence of ovaries, bilateral: Secondary | ICD-10-CM | POA: Diagnosis not present

## 2018-11-04 NOTE — Progress Notes (Signed)
NAMIAH KALLIN Apr 05, 1955 [id number]   History:    63 y.o. G2P2L2 Married.  1 grand-daughter.  RP:  Established patient presenting for annual gyn exam   HPI: S/P TAH/BSO.  No pelvic pain.  No pain with intercourse.  Urine and bowel movements normal.  Breasts normal.  Body mass index 25.82.  Good fitness and healthy nutrition.  Health labs with family physician.  Past medical history,surgical history, family history and social history were all reviewed and documented in the EPIC chart.  Gynecologic History No LMP recorded. Patient has had a hysterectomy. Contraception: status post hysterectomy Last Pap: 10/2016. Results were: Negative Last mammogram: 09/2018. Results were: Negative Bone Density: 12/2014 Normal Colonoscopy: 2013 with Dr Collene Mares, 10 yr schedule.  Obstetric History OB History  Gravida Para Term Preterm AB Living  2 2       2   SAB TAB Ectopic Multiple Live Births               # Outcome Date GA Lbr Len/2nd Weight Sex Delivery Anes PTL Lv  2 Para     F Vag-Spont     1 Para     F Vag-Spont        ROS: A ROS was performed and pertinent positives and negatives are included in the history.  GENERAL: No fevers or chills. HEENT: No change in vision, no earache, sore throat or sinus congestion. NECK: No pain or stiffness. CARDIOVASCULAR: No chest pain or pressure. No palpitations. PULMONARY: No shortness of breath, cough or wheeze. GASTROINTESTINAL: No abdominal pain, nausea, vomiting or diarrhea, melena or bright red blood per rectum. GENITOURINARY: No urinary frequency, urgency, hesitancy or dysuria. MUSCULOSKELETAL: No joint or muscle pain, no back pain, no recent trauma. DERMATOLOGIC: No rash, no itching, no lesions. ENDOCRINE: No polyuria, polydipsia, no heat or cold intolerance. No recent change in weight. HEMATOLOGICAL: No anemia or easy bruising or bleeding. NEUROLOGIC: No headache, seizures, numbness, tingling or weakness. PSYCHIATRIC: No depression, no loss of  interest in normal activity or change in sleep pattern.     Exam:   BP 126/80 (BP Location: Right Arm, Patient Position: Sitting, Cuff Size: Normal)    Ht 5\' 6"  (1.676 m)    Wt 160 lb (72.6 kg)    BMI 25.82 kg/m   Body mass index is 25.82 kg/m.  General appearance : Well developed well nourished female. No acute distress HEENT: Eyes: no retinal hemorrhage or exudates,  Neck supple, trachea midline, no carotid bruits, no thyroidmegaly Lungs: Clear to auscultation, no rhonchi or wheezes, or rib retractions  Heart: Regular rate and rhythm, no murmurs or gallops Breast:Examined in sitting and supine position were symmetrical in appearance, no palpable masses or tenderness,  no skin retraction, no nipple inversion, no nipple discharge, no skin discoloration, no axillary or supraclavicular lymphadenopathy Abdomen: no palpable masses or tenderness, no rebound or guarding Extremities: no edema or skin discoloration or tenderness  Pelvic: Vulva: Normal             Vagina: No gross lesions or discharge  Cervix/Uterus absent  Adnexa  Without masses or tenderness  Anus: Normal   Assessment/Plan:  63 y.o. female for annual exam   1. Well female exam with routine gynecological exam Gynecologic exam status post TAH/BSO.  Pap test in October 2018 was negative, no indication to repeat.  Breast exam normal.  Screening mammogram September 2020 was negative.  Colonoscopy in 2013, on a 10-year schedule.  Health labs with family physician.  Good body mass index at 25.82.  Continue with fitness and healthy nutrition.  2. S/P TAH-BSO  3. Postmenopause Well on no hormone replacement therapy.  Bone density December 2016 was normal.  Will repeat bone density at 5 years.  Vitamin D supplements, calcium intake of 1200 mg daily and regular weightbearing physical activities.  Princess Bruins MD, 12:12 PM 11/04/2018

## 2018-11-09 ENCOUNTER — Encounter: Payer: 59 | Admitting: Women's Health

## 2018-11-12 ENCOUNTER — Encounter: Payer: Self-pay | Admitting: Obstetrics & Gynecology

## 2018-11-12 NOTE — Patient Instructions (Signed)
1. Well female exam with routine gynecological exam Gynecologic exam status post TAH/BSO.  Pap test in October 2018 was negative, no indication to repeat.  Breast exam normal.  Screening mammogram September 2020 was negative.  Colonoscopy in 2013, on a 10-year schedule.  Health labs with family physician.  Good body mass index at 25.82.  Continue with fitness and healthy nutrition.  2. S/P TAH-BSO  3. Postmenopause Well on no hormone replacement therapy.  Bone density December 2016 was normal.  Will repeat bone density at 5 years.  Vitamin D supplements, calcium intake of 1200 mg daily and regular weightbearing physical activities.  Courtney Baxter, it was a pleasure seeing you today!

## 2019-03-27 ENCOUNTER — Ambulatory Visit: Payer: 59 | Attending: Internal Medicine

## 2019-03-27 DIAGNOSIS — Z23 Encounter for immunization: Secondary | ICD-10-CM | POA: Insufficient documentation

## 2019-03-27 NOTE — Progress Notes (Signed)
   Covid-19 Vaccination Clinic  Name:  Courtney Baxter    MRN: [id number] DOB: Mar 22, 1955  03/27/2019  [name] was observed post Covid-19 immunization for 15 minutes without incident. She was provided with Vaccine Information Sheet and instruction to access the V-Safe system.   [name] was instructed to call 911 with any severe reactions post vaccine: Marland Kitchen Difficulty breathing  . Swelling of face and throat  . A fast heartbeat  . A bad rash all over body  . Dizziness and weakness   Immunizations Administered    Name Date Dose VIS Date Route   Pfizer COVID-19 Vaccine 03/27/2019  6:44 PM 0.3 mL 12/31/2018 Intramuscular   Manufacturer: [place]   Lot: [id number]   [place]: [id number]

## 2019-04-27 ENCOUNTER — Ambulatory Visit: Payer: 59 | Attending: Internal Medicine

## 2019-04-27 ENCOUNTER — Ambulatory Visit: Payer: 59

## 2019-04-27 DIAGNOSIS — Z23 Encounter for immunization: Secondary | ICD-10-CM

## 2019-04-27 NOTE — Progress Notes (Signed)
   Covid-19 Vaccination Clinic  Name:  Courtney Baxter    MRN: [id number] DOB: 02-01-1955  04/27/2019  Courtney Baxter was observed post Covid-19 immunization for 15 minutes without incident. She was provided with Vaccine Information Sheet and instruction to access the V-Safe system.   Courtney Baxter was instructed to call 911 with any severe reactions post vaccine: Marland Kitchen Difficulty breathing  . Swelling of face and throat  . A fast heartbeat  . A bad rash all over body  . Dizziness and weakness   Immunizations Administered    Name Date Dose VIS Date Route   Pfizer COVID-19 Vaccine 04/27/2019  2:06 PM 0.3 mL 12/31/2018 Intramuscular   Manufacturer: [place]   Lot: [id number]   [place]: [id number]

## 2019-08-19 ENCOUNTER — Other Ambulatory Visit: Payer: Self-pay | Admitting: Obstetrics & Gynecology

## 2019-08-19 DIAGNOSIS — Z1231 Encounter for screening mammogram for malignant neoplasm of breast: Secondary | ICD-10-CM

## 2019-10-03 ENCOUNTER — Other Ambulatory Visit: Payer: Self-pay

## 2019-10-03 ENCOUNTER — Ambulatory Visit
Admission: RE | Admit: 2019-10-03 | Discharge: 2019-10-03 | Disposition: A | Discharge status: Home / Self Care | Payer: 59 | Source: Ambulatory Visit | Attending: Obstetrics & Gynecology | Admitting: Obstetrics & Gynecology

## 2019-10-03 DIAGNOSIS — Z1231 Encounter for screening mammogram for malignant neoplasm of breast: Secondary | ICD-10-CM

## 2019-10-10 IMAGING — CR DG RIBS W/ CHEST 3+V*R*
3 series · 3 of 3 positions shown · non-contrast
Comparison: None.

CLINICAL DATA: Right-sided chest wall pain.

EXAM:
RIGHT RIBS AND CHEST - 3+ VIEW

[w chest pa]
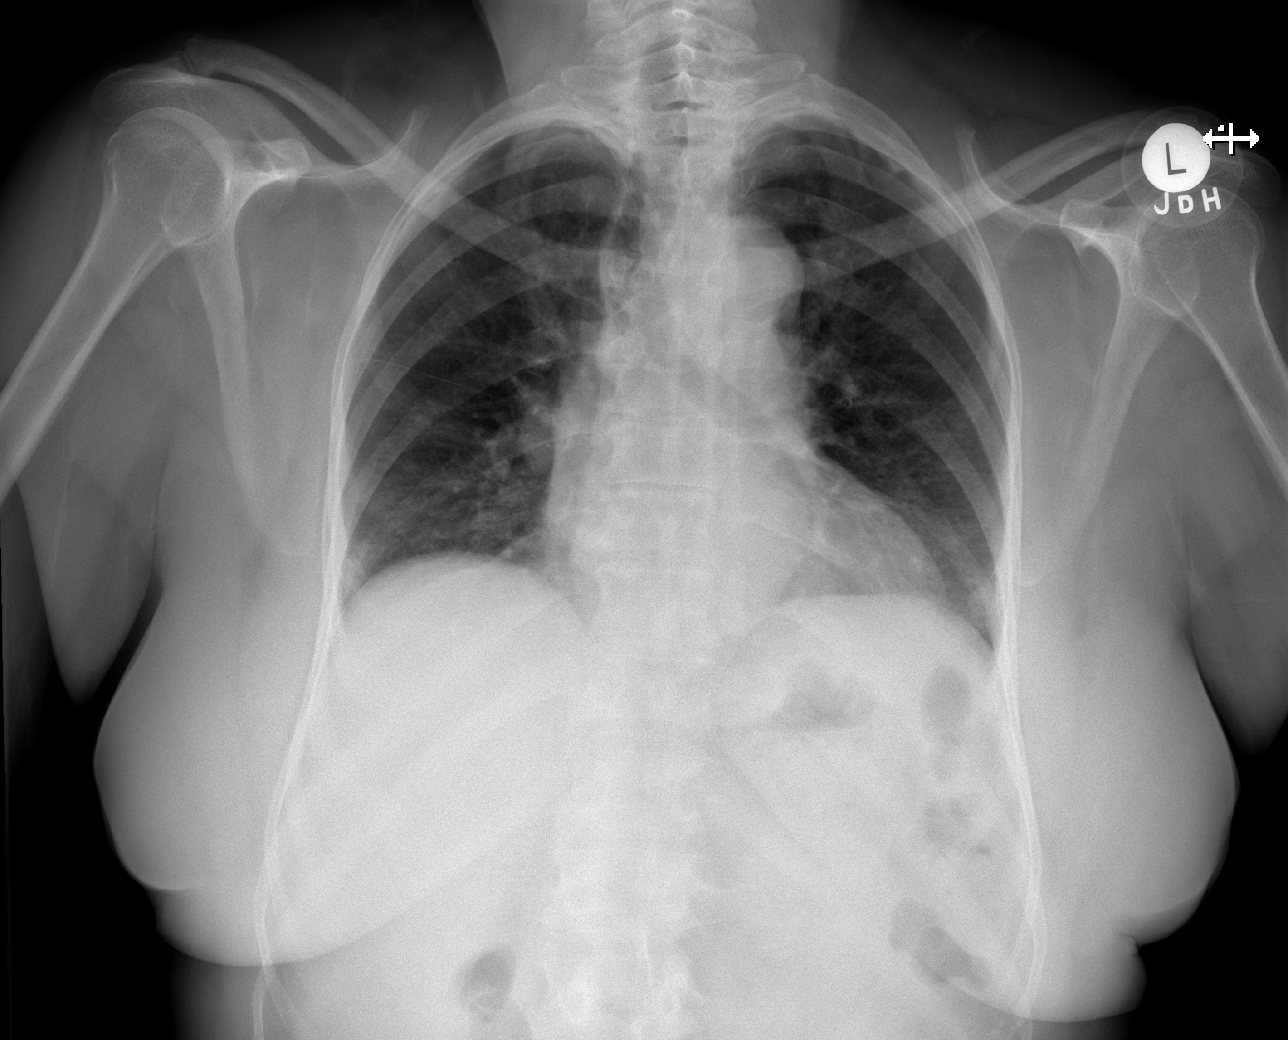

[w ribs ap lower right]
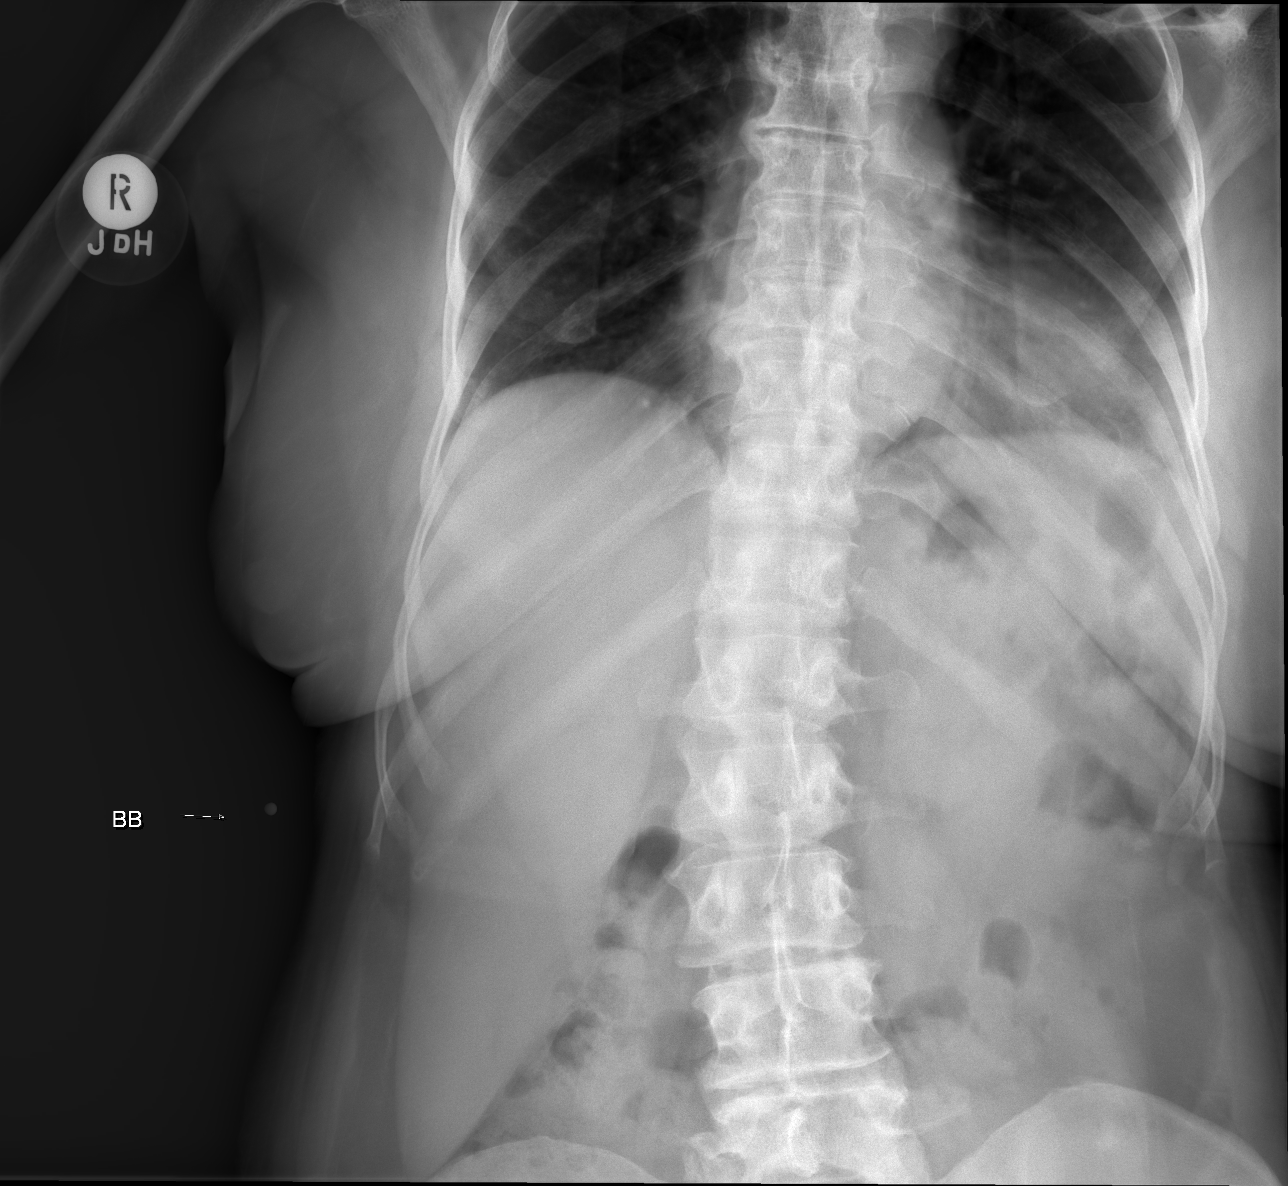

[w ribs obl right]
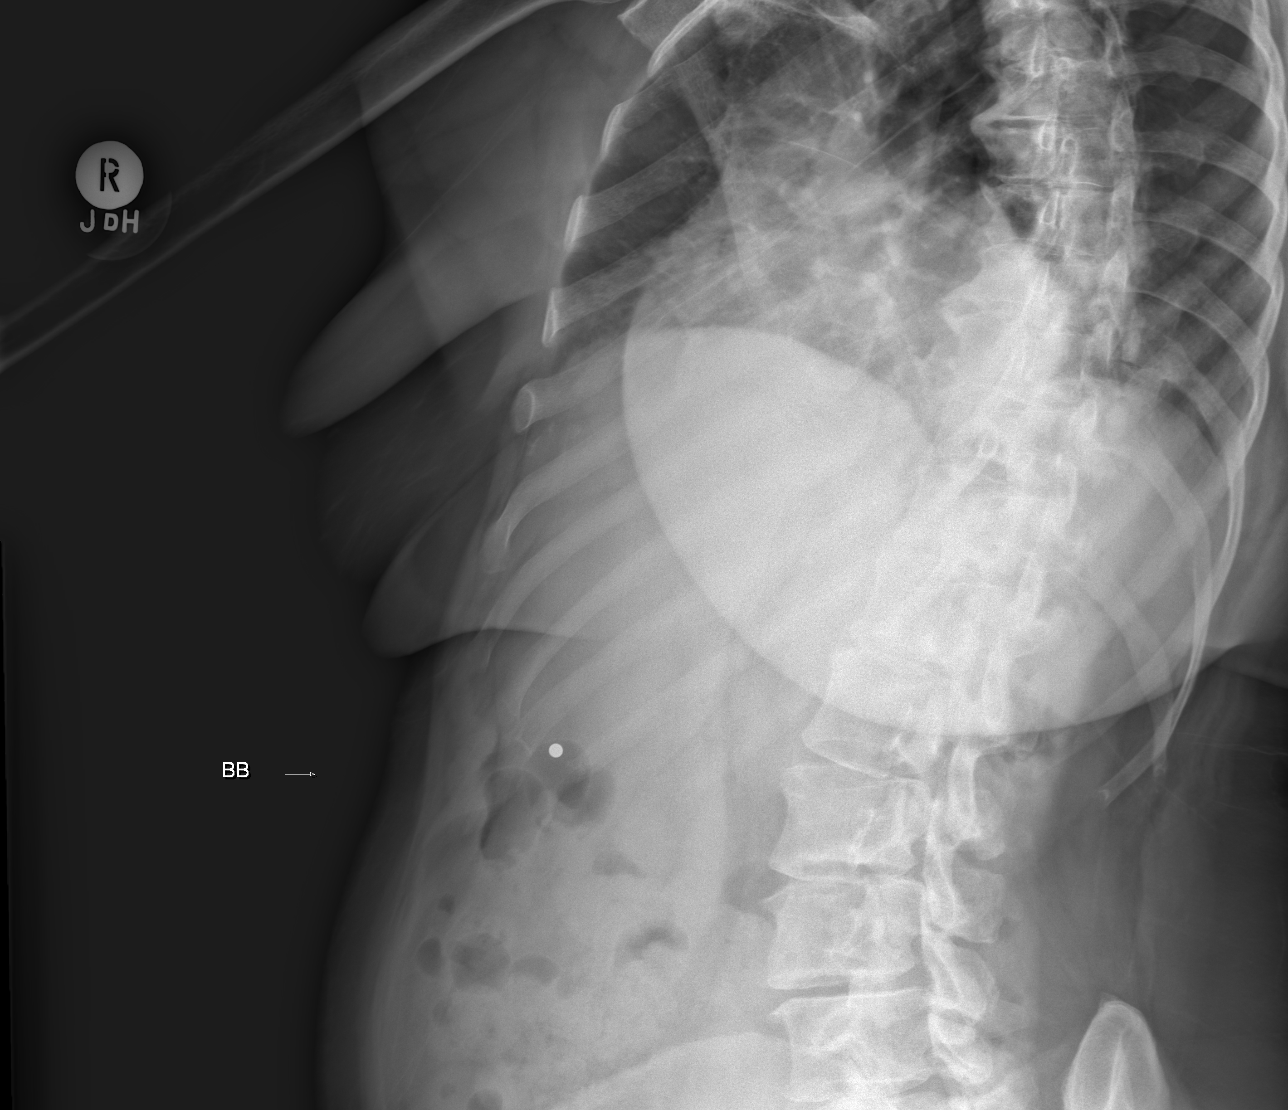

[3 of 3 positions shown; findings below may reference images not displayed]

FINDINGS: Lung volumes are low bilaterally with bibasilar atelectasis present.
There is no evidence of pulmonary edema, consolidation,
pneumothorax, nodule or pleural fluid. The heart size is normal. The
aorta is mildly tortuous.

No evidence of right-sided rib fracture or bony lesion. Other
visualized bony structures are unremarkable.
IMPRESSION: No acute findings.  Low lung volumes with bibasilar atelectasis.

## 2019-10-21 ENCOUNTER — Ambulatory Visit: Payer: 59

## 2019-11-07 ENCOUNTER — Other Ambulatory Visit: Payer: Self-pay

## 2019-11-07 ENCOUNTER — Ambulatory Visit (INDEPENDENT_AMBULATORY_CARE_PROVIDER_SITE_OTHER): Payer: 59 | Admitting: Obstetrics & Gynecology

## 2019-11-07 ENCOUNTER — Encounter: Payer: Self-pay | Admitting: Obstetrics & Gynecology

## 2019-11-07 VITALS — BP 140/92 | Ht 66.25 in | Wt 164.0 lb

## 2019-11-07 DIAGNOSIS — Z780 Asymptomatic menopausal state: Secondary | ICD-10-CM | POA: Diagnosis not present

## 2019-11-07 DIAGNOSIS — Z90722 Acquired absence of ovaries, bilateral: Secondary | ICD-10-CM

## 2019-11-07 DIAGNOSIS — Z9079 Acquired absence of other genital organ(s): Secondary | ICD-10-CM

## 2019-11-07 DIAGNOSIS — Z01419 Encounter for gynecological examination (general) (routine) without abnormal findings: Secondary | ICD-10-CM

## 2019-11-07 DIAGNOSIS — Z1272 Encounter for screening for malignant neoplasm of vagina: Secondary | ICD-10-CM

## 2019-11-07 DIAGNOSIS — Z13820 Encounter for screening for osteoporosis: Secondary | ICD-10-CM

## 2019-11-07 DIAGNOSIS — Z90710 Acquired absence of both cervix and uterus: Secondary | ICD-10-CM

## 2019-11-07 NOTE — Addendum Note (Signed)
Addended by: Thurnell Garbe A on: 11/07/2019 03:21 PM   Modules accepted: Orders

## 2019-11-07 NOTE — Progress Notes (Signed)
Courtney Baxter Nov 23, 1955 [id number]   History:    64 y.o.  G2P2L2 Married.  1 grand-daughter.  RP:  Established patient presenting for annual gyn exam   HPI: S/P TAH/BSO.  Postmenopause, well on no HRT.  No pelvic pain.  No pain with intercourse.  Urine and bowel movements normal.  Breasts normal.  Body mass index 26.27.  Good fitness and healthy nutrition.  Health labs with family physician.  Colonoscopy with excision of benign Polyps in 2013 with Dr Collene Mares.  Past medical history,surgical history, family history and social history were all reviewed and documented in the EPIC chart.  Gynecologic History No LMP recorded. Patient has had a hysterectomy.  Obstetric History OB History  Gravida Para Term Preterm AB Living  2 2       2   SAB TAB Ectopic Multiple Live Births               # Outcome Date GA Lbr Len/2nd Weight Sex Delivery Anes PTL Lv  2 Para     F Vag-Spont     1 Para     F Vag-Spont        ROS: A ROS was performed and pertinent positives and negatives are included in the history.  GENERAL: No fevers or chills. HEENT: No change in vision, no earache, sore throat or sinus congestion. NECK: No pain or stiffness. CARDIOVASCULAR: No chest pain or pressure. No palpitations. PULMONARY: No shortness of breath, cough or wheeze. GASTROINTESTINAL: No abdominal pain, nausea, vomiting or diarrhea, melena or bright red blood per rectum. GENITOURINARY: No urinary frequency, urgency, hesitancy or dysuria. MUSCULOSKELETAL: No joint or muscle pain, no back pain, no recent trauma. DERMATOLOGIC: No rash, no itching, no lesions. ENDOCRINE: No polyuria, polydipsia, no heat or cold intolerance. No recent change in weight. HEMATOLOGICAL: No anemia or easy bruising or bleeding. NEUROLOGIC: No headache, seizures, numbness, tingling or weakness. PSYCHIATRIC: No depression, no loss of interest in normal activity or change in sleep pattern.     Exam:   BP (!) 140/92   Ht 5' 6.25" (1.683 m)    Wt 164 lb (74.4 kg)   BMI 26.27 kg/m   Body mass index is 26.27 kg/m.  General appearance : Well developed well nourished female. No acute distress HEENT: Eyes: no retinal hemorrhage or exudates,  Neck supple, trachea midline, no carotid bruits, no thyroidmegaly Lungs: Clear to auscultation, no rhonchi or wheezes, or rib retractions  Heart: Regular rate and rhythm, no murmurs or gallops Breast:Examined in sitting and supine position were symmetrical in appearance, no palpable masses or tenderness,  no skin retraction, no nipple inversion, no nipple discharge, no skin discoloration, no axillary or supraclavicular lymphadenopathy Abdomen: no palpable masses or tenderness, no rebound or guarding Extremities: no edema or skin discoloration or tenderness  Pelvic: Vulva: Normal             Vagina: No gross lesions or discharge.  Pap reflex done.  Cervix/Uterus absent  Adnexa  Without masses or tenderness  Anus: Normal   Assessment/Plan:  64 y.o. female for annual exam   1. Encounter for Papanicolaou smear of vagina as part of routine gynecological examination Gynecologic exam status post TAH/BSO.  Pap reflex done on the vaginal vault.  Breast exam normal.  Screening mammogram Negative September 2021.  Will call Dr. Lorie Apley office to schedule a colonoscopy.  Health labs with family physician.  2. S/P TAH-BSO  3. Postmenopause Well on no hormone replacement therapy.  4.  Screening for osteoporosis Last bone density in December 2016 was normal.  Will repeat a bone density in the coming year, bone density ordered.  Vitamin D supplements, calcium intake of 1500 mg daily and regular weightbearing physical activities recommended. - DG Bone Density; Future  Princess Bruins MD, 12:07 PM 11/07/2019

## 2019-11-08 LAB — PAP IG W/ RFLX HPV ASCU

## 2019-12-29 ENCOUNTER — Ambulatory Visit: Payer: 59 | Attending: Critical Care Medicine

## 2019-12-29 DIAGNOSIS — Z23 Encounter for immunization: Secondary | ICD-10-CM

## 2019-12-29 NOTE — Progress Notes (Signed)
   Covid-19 Vaccination Clinic  Name:  Courtney Baxter    MRN: [id number] DOB: 13-Oct-1955  12/29/2019  [name] was observed post Covid-19 immunization for 15 minutes without incident. She was provided with Vaccine Information Sheet and instruction to access the V-Safe system.   [name] was instructed to call 911 with any severe reactions post vaccine: Marland Kitchen Difficulty breathing  . Swelling of face and throat  . A fast heartbeat  . A bad rash all over body  . Dizziness and weakness   Immunizations Administered    Name Date Dose VIS Date Route   Pfizer COVID-19 Vaccine 12/29/2019  2:32 PM 0.3 mL 11/09/2019 Intramuscular   Manufacturer: [place]   Lot: [id number]   [facility]: [address]-3

## 2020-01-03 ENCOUNTER — Other Ambulatory Visit: Payer: Self-pay | Admitting: Obstetrics & Gynecology

## 2020-01-03 ENCOUNTER — Ambulatory Visit (INDEPENDENT_AMBULATORY_CARE_PROVIDER_SITE_OTHER): Payer: 59

## 2020-01-03 ENCOUNTER — Other Ambulatory Visit: Payer: Self-pay

## 2020-01-03 DIAGNOSIS — Z780 Asymptomatic menopausal state: Secondary | ICD-10-CM

## 2020-01-03 DIAGNOSIS — Z13820 Encounter for screening for osteoporosis: Secondary | ICD-10-CM | POA: Diagnosis not present

## 2020-08-31 ENCOUNTER — Other Ambulatory Visit: Payer: Self-pay | Admitting: Obstetrics & Gynecology

## 2020-08-31 DIAGNOSIS — Z1231 Encounter for screening mammogram for malignant neoplasm of breast: Secondary | ICD-10-CM

## 2020-10-19 ENCOUNTER — Ambulatory Visit
Admission: RE | Admit: 2020-10-19 | Discharge: 2020-10-19 | Disposition: A | Discharge status: Home / Self Care | Payer: 59 | Source: Ambulatory Visit | Attending: Obstetrics & Gynecology | Admitting: Obstetrics & Gynecology

## 2020-10-19 ENCOUNTER — Other Ambulatory Visit: Payer: Self-pay

## 2020-10-19 DIAGNOSIS — Z1231 Encounter for screening mammogram for malignant neoplasm of breast: Secondary | ICD-10-CM

## 2020-11-08 ENCOUNTER — Ambulatory Visit: Payer: Self-pay | Admitting: Obstetrics & Gynecology

## 2020-11-27 ENCOUNTER — Other Ambulatory Visit: Payer: Self-pay

## 2020-11-27 ENCOUNTER — Ambulatory Visit (INDEPENDENT_AMBULATORY_CARE_PROVIDER_SITE_OTHER): Payer: 59 | Admitting: Obstetrics & Gynecology

## 2020-11-27 ENCOUNTER — Encounter: Payer: Self-pay | Admitting: Obstetrics & Gynecology

## 2020-11-27 VITALS — BP 118/70 | HR 83 | Resp 16 | Ht 66.25 in | Wt 167.0 lb

## 2020-11-27 DIAGNOSIS — Z90722 Acquired absence of ovaries, bilateral: Secondary | ICD-10-CM

## 2020-11-27 DIAGNOSIS — Z780 Asymptomatic menopausal state: Secondary | ICD-10-CM

## 2020-11-27 DIAGNOSIS — Z90710 Acquired absence of both cervix and uterus: Secondary | ICD-10-CM | POA: Diagnosis not present

## 2020-11-27 DIAGNOSIS — Z01419 Encounter for gynecological examination (general) (routine) without abnormal findings: Secondary | ICD-10-CM

## 2020-11-27 DIAGNOSIS — Z9079 Acquired absence of other genital organ(s): Secondary | ICD-10-CM

## 2020-11-27 NOTE — Progress Notes (Signed)
Courtney Baxter May 14, 1955 [id number]   History:    65 y.o. G2P2L2 Married.  1 grand-daughter.   RP:  Established patient presenting for annual gyn exam    HPI: S/P TAH/BSO.  Postmenopause, well on no HRT.  No pelvic pain.  No pain with intercourse. Pap Neg 10/2019.  Urine and bowel movements normal.  Breasts normal.  Mammo Neg 09/2020. Body mass index 26.75.  Good fitness and healthy nutrition.  Health labs with family physician.  BD 12/2019 Normal.  Colonoscopy with excision of benign Polyps in 2013 with Dr Collene Mares, will repeat Coloscopy in 2023.    Past medical history,surgical history, family history and social history were all reviewed and documented in the EPIC chart.  Gynecologic History No LMP recorded. Patient has had a hysterectomy.  Obstetric History OB History  Gravida Para Term Preterm AB Living  2 2       2   SAB IAB Ectopic Multiple Live Births               # Outcome Date GA Lbr Len/2nd Weight Sex Delivery Anes PTL Lv  2 Para     F Vag-Spont     1 Para     F Vag-Spont        ROS: A ROS was performed and pertinent positives and negatives are included in the history.  GENERAL: No fevers or chills. HEENT: No change in vision, no earache, sore throat or sinus congestion. NECK: No pain or stiffness. CARDIOVASCULAR: No chest pain or pressure. No palpitations. PULMONARY: No shortness of breath, cough or wheeze. GASTROINTESTINAL: No abdominal pain, nausea, vomiting or diarrhea, melena or bright red blood per rectum. GENITOURINARY: No urinary frequency, urgency, hesitancy or dysuria. MUSCULOSKELETAL: No joint or muscle pain, no back pain, no recent trauma. DERMATOLOGIC: No rash, no itching, no lesions. ENDOCRINE: No polyuria, polydipsia, no heat or cold intolerance. No recent change in weight. HEMATOLOGICAL: No anemia or easy bruising or bleeding. NEUROLOGIC: No headache, seizures, numbness, tingling or weakness. PSYCHIATRIC: No depression, no loss of interest in normal activity  or change in sleep pattern.     Exam:   BP 118/70   Pulse 83   Resp 16   Ht 5' 6.25" (1.683 m)   Wt 167 lb (75.8 kg)   BMI 26.75 kg/m   Body mass index is 26.75 kg/m.  General appearance : Well developed well nourished female. No acute distress HEENT: Eyes: no retinal hemorrhage or exudates,  Neck supple, trachea midline, no carotid bruits, no thyroidmegaly Lungs: Clear to auscultation, no rhonchi or wheezes, or rib retractions  Heart: Regular rate and rhythm, no murmurs or gallops Breast:Examined in sitting and supine position were symmetrical in appearance, no palpable masses or tenderness,  no skin retraction, no nipple inversion, no nipple discharge, no skin discoloration, no axillary or supraclavicular lymphadenopathy Abdomen: no palpable masses or tenderness, no rebound or guarding Extremities: no edema or skin discoloration or tenderness  Pelvic: Vulva: Normal             Vagina: No gross lesions or discharge  Cervix/Uterus absent  Adnexa  Without masses or tenderness  Anus: Normal   Assessment/Plan:  65 y.o. female for annual exam   1. Well female exam with routine gynecological exam S/P TAH/BSO.  Postmenopause, well on no HRT.  No pelvic pain.  No pain with intercourse. Pap Neg 10/2019.  Urine and bowel movements normal.  Breasts normal.  Mammo Neg 09/2020. Body mass index 26.75.  Good fitness and healthy nutrition.  Health labs with family physician.  BD 12/2019 Normal.  Colonoscopy with excision of benign Polyps in 2013 with Dr Collene Mares, will repeat Coloscopy in 2023.  2. S/P TAH-BSO  3. Postmenopause  Well on no HRT.  BD Normal  12/2019, will repeat at 5 yrs.  Princess Bruins MD, 12:27 PM 11/27/2020

## 2021-06-21 DIAGNOSIS — M545 Low back pain, unspecified: Secondary | ICD-10-CM | POA: Diagnosis not present

## 2021-06-21 DIAGNOSIS — R030 Elevated blood-pressure reading, without diagnosis of hypertension: Secondary | ICD-10-CM | POA: Diagnosis not present

## 2021-09-16 ENCOUNTER — Other Ambulatory Visit: Payer: Self-pay | Admitting: Obstetrics & Gynecology

## 2021-09-16 DIAGNOSIS — Z1231 Encounter for screening mammogram for malignant neoplasm of breast: Secondary | ICD-10-CM

## 2021-10-23 ENCOUNTER — Ambulatory Visit: Payer: 59

## 2021-10-25 ENCOUNTER — Ambulatory Visit
Admission: RE | Admit: 2021-10-25 | Discharge: 2021-10-25 | Disposition: A | Discharge status: Home / Self Care | Payer: Medicare HMO | Source: Ambulatory Visit | Attending: Obstetrics & Gynecology | Admitting: Obstetrics & Gynecology

## 2021-10-25 DIAGNOSIS — Z1231 Encounter for screening mammogram for malignant neoplasm of breast: Secondary | ICD-10-CM

## 2021-11-06 DIAGNOSIS — H903 Sensorineural hearing loss, bilateral: Secondary | ICD-10-CM | POA: Diagnosis not present

## 2021-11-06 DIAGNOSIS — H9202 Otalgia, left ear: Secondary | ICD-10-CM | POA: Diagnosis not present

## 2021-11-25 DIAGNOSIS — G43819 Other migraine, intractable, without status migrainosus: Secondary | ICD-10-CM | POA: Diagnosis not present

## 2021-11-25 DIAGNOSIS — Z0000 Encounter for general adult medical examination without abnormal findings: Secondary | ICD-10-CM | POA: Diagnosis not present

## 2021-11-25 DIAGNOSIS — E782 Mixed hyperlipidemia: Secondary | ICD-10-CM | POA: Diagnosis not present

## 2021-11-25 DIAGNOSIS — Z23 Encounter for immunization: Secondary | ICD-10-CM | POA: Diagnosis not present

## 2021-11-25 DIAGNOSIS — R030 Elevated blood-pressure reading, without diagnosis of hypertension: Secondary | ICD-10-CM | POA: Diagnosis not present

## 2021-12-03 ENCOUNTER — Encounter: Payer: Self-pay | Admitting: Obstetrics & Gynecology

## 2021-12-03 ENCOUNTER — Ambulatory Visit (INDEPENDENT_AMBULATORY_CARE_PROVIDER_SITE_OTHER): Payer: Medicare HMO | Admitting: Obstetrics & Gynecology

## 2021-12-03 VITALS — BP 114/76 | HR 79 | Ht 66.25 in | Wt 168.0 lb

## 2021-12-03 DIAGNOSIS — Z780 Asymptomatic menopausal state: Secondary | ICD-10-CM

## 2021-12-03 DIAGNOSIS — Z90710 Acquired absence of both cervix and uterus: Secondary | ICD-10-CM

## 2021-12-03 DIAGNOSIS — Z90722 Acquired absence of ovaries, bilateral: Secondary | ICD-10-CM

## 2021-12-03 DIAGNOSIS — Z01419 Encounter for gynecological examination (general) (routine) without abnormal findings: Secondary | ICD-10-CM | POA: Diagnosis not present

## 2021-12-03 NOTE — Progress Notes (Signed)
Courtney Baxter 03/06/1955 [id number]   History:    66 y.o. G2P2L2 Married.  1 grand-daughter.   RP:  Established patient presenting for annual gyn exam    HPI: S/P TAH/BSO.  Postmenopause, well on no HRT.  No pelvic pain. No pain with intercourse. No H/O abnormal Pap. Pap Neg 10/2019.  Recommend repeating a Pap at 5 years.  Urine and bowel movements normal.  Breasts normal.  Mammo Neg 10/2021. Body mass index 26.91.  Good fitness and healthy nutrition.  Health labs with family physician.  BD 12/2019 Normal, repeat at 5 years.  Colonoscopy 2016, repeat in 2026 per patient.  Flu vaccine done at work.   Past medical history,surgical history, family history and social history were all reviewed and documented in the EPIC chart.  Gynecologic History No LMP recorded. Patient has had a hysterectomy.  Obstetric History OB History  Gravida Para Term Preterm AB Living  2 2 0     2  SAB IAB Ectopic Multiple Live Births               # Outcome Date GA Lbr Len/2nd Weight Sex Delivery Anes PTL Lv  2 Para     F Vag-Spont     1 Para     F Vag-Spont        ROS: A ROS was performed and pertinent positives and negatives are included in the history. GENERAL: No fevers or chills. HEENT: No change in vision, no earache, sore throat or sinus congestion. NECK: No pain or stiffness. CARDIOVASCULAR: No chest pain or pressure. No palpitations. PULMONARY: No shortness of breath, cough or wheeze. GASTROINTESTINAL: No abdominal pain, nausea, vomiting or diarrhea, melena or bright red blood per rectum. GENITOURINARY: No urinary frequency, urgency, hesitancy or dysuria. MUSCULOSKELETAL: No joint or muscle pain, no back pain, no recent trauma. DERMATOLOGIC: No rash, no itching, no lesions. ENDOCRINE: No polyuria, polydipsia, no heat or cold intolerance. No recent change in weight. HEMATOLOGICAL: No anemia or easy bruising or bleeding. NEUROLOGIC: No headache, seizures, numbness, tingling or weakness. PSYCHIATRIC:  No depression, no loss of interest in normal activity or change in sleep pattern.     Exam:   BP 114/76   Pulse 79   Ht 5' 6.25" (1.683 m)   Wt 168 lb (76.2 kg)   SpO2 99%   BMI 26.91 kg/m   Body mass index is 26.91 kg/m.  General appearance : Well developed well nourished female. No acute distress HEENT: Eyes: no retinal hemorrhage or exudates,  Neck supple, trachea midline, no carotid bruits, no thyroidmegaly Lungs: Clear to auscultation, no rhonchi or wheezes, or rib retractions  Heart: Regular rate and rhythm, no murmurs or gallops Breast:Examined in sitting and supine position were symmetrical in appearance, no palpable masses or tenderness,  no skin retraction, no nipple inversion, no nipple discharge, no skin discoloration, no axillary or supraclavicular lymphadenopathy Abdomen: no palpable masses or tenderness, no rebound or guarding Extremities: no edema or skin discoloration or tenderness  Pelvic: Vulva: Normal             Vagina: No gross lesions or discharge  Cervix/Uterus: Absent  Adnexa:  Without masses or tenderness  Anus: Normal   Assessment/Plan:  66 y.o. female for annual exam   1. Well female exam with routine gynecological exam S/P TAH/BSO.  Postmenopause, well on no HRT.  No pelvic pain. No pain with intercourse. No H/O abnormal Pap. Pap Neg 10/2019.  Recommend repeating a Pap at  5 years.  Urine and bowel movements normal.  Breasts normal.  Mammo Neg 10/2021. Body mass index 26.91.  Good fitness and healthy nutrition.  Health labs with family physician.  BD 12/2019 Normal, repeat at 5 years.  Colonoscopy 2016, repeat in 2026 per patient.  Flu vaccine done at work.  2. S/P TAH-BSO  3. Postmenopause  S/P TAH/BSO.  Postmenopause, well on no HRT.  No pelvic pain. No pain with intercourse.  BD 12/2019 Normal, repeat at 5 years.   Princess Bruins MD, 10:39 AM 12/03/2021

## 2022-01-08 DIAGNOSIS — H9202 Otalgia, left ear: Secondary | ICD-10-CM | POA: Diagnosis not present

## 2022-04-14 DIAGNOSIS — H52223 Regular astigmatism, bilateral: Secondary | ICD-10-CM | POA: Diagnosis not present

## 2022-04-14 DIAGNOSIS — H25813 Combined forms of age-related cataract, bilateral: Secondary | ICD-10-CM | POA: Diagnosis not present

## 2022-04-14 DIAGNOSIS — H0288B Meibomian gland dysfunction left eye, upper and lower eyelids: Secondary | ICD-10-CM | POA: Diagnosis not present

## 2022-04-14 DIAGNOSIS — H40013 Open angle with borderline findings, low risk, bilateral: Secondary | ICD-10-CM | POA: Diagnosis not present

## 2022-04-14 DIAGNOSIS — H524 Presbyopia: Secondary | ICD-10-CM | POA: Diagnosis not present

## 2022-04-14 DIAGNOSIS — H0288A Meibomian gland dysfunction right eye, upper and lower eyelids: Secondary | ICD-10-CM | POA: Diagnosis not present

## 2022-04-14 DIAGNOSIS — H5203 Hypermetropia, bilateral: Secondary | ICD-10-CM | POA: Diagnosis not present

## 2022-04-14 DIAGNOSIS — H04123 Dry eye syndrome of bilateral lacrimal glands: Secondary | ICD-10-CM | POA: Diagnosis not present

## 2022-06-10 IMAGING — MG MM DIGITAL SCREENING BILAT W/ TOMO AND CAD
8 series · 8 of 24 positions shown · non-contrast
Comparison: Previous exam(s).

CLINICAL DATA: Screening.

EXAM:
DIGITAL SCREENING BILATERAL MAMMOGRAM WITH TOMOSYNTHESIS AND CAD
TECHNIQUE: Bilateral screening digital craniocaudal and mediolateral oblique
mammograms were obtained. Bilateral screening digital breast
tomosynthesis was performed. The images were evaluated with
computer-aided detection.

[L MLO synth-2D]
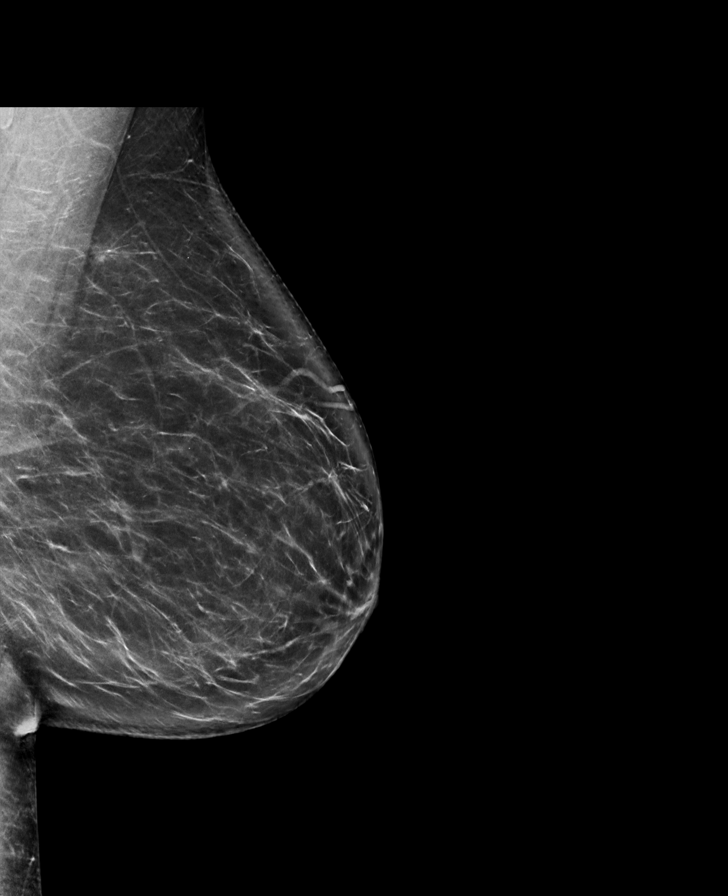

[R CC synth-2D]
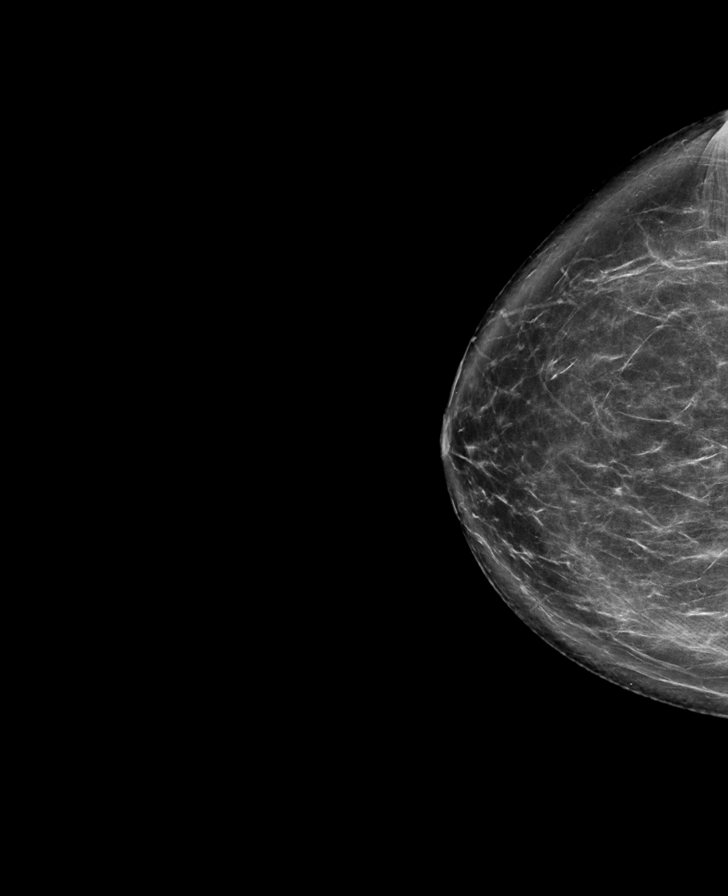

[L CC synth-2D]
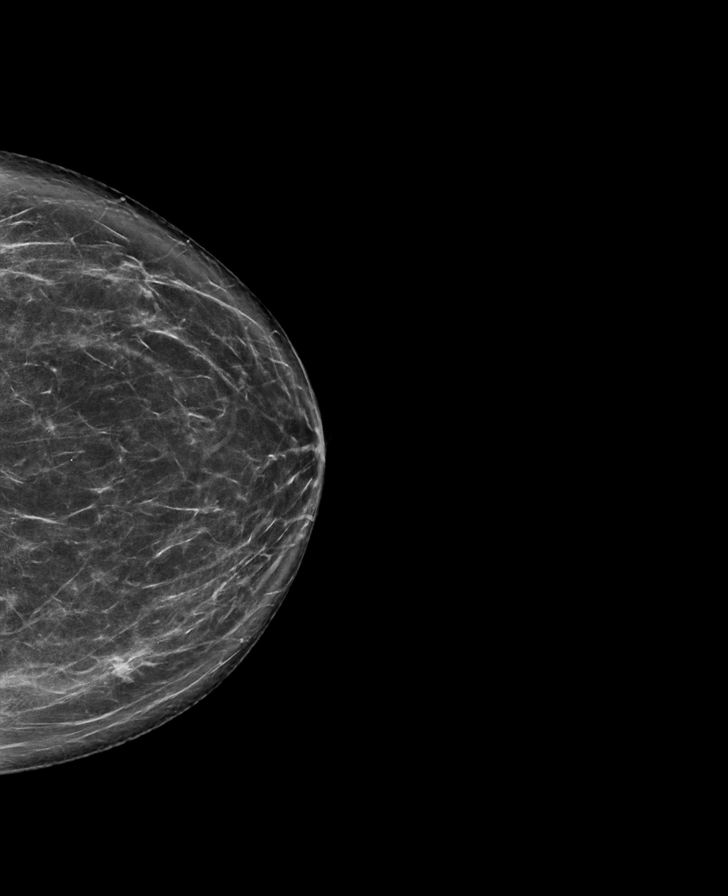

[R MLO synth-2D]
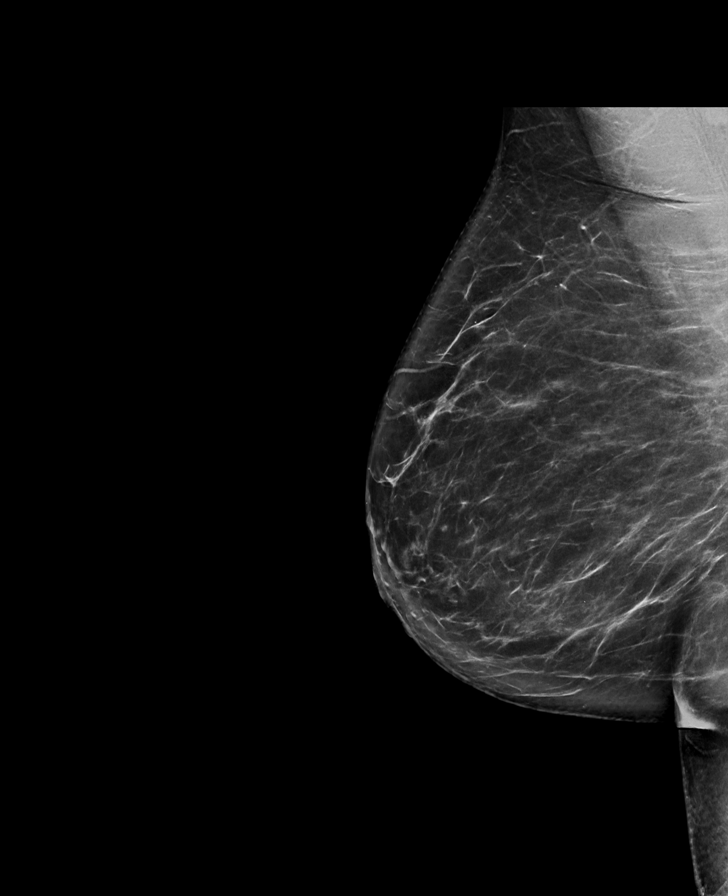

[R CC tomo · tomo slice 45/89.0]
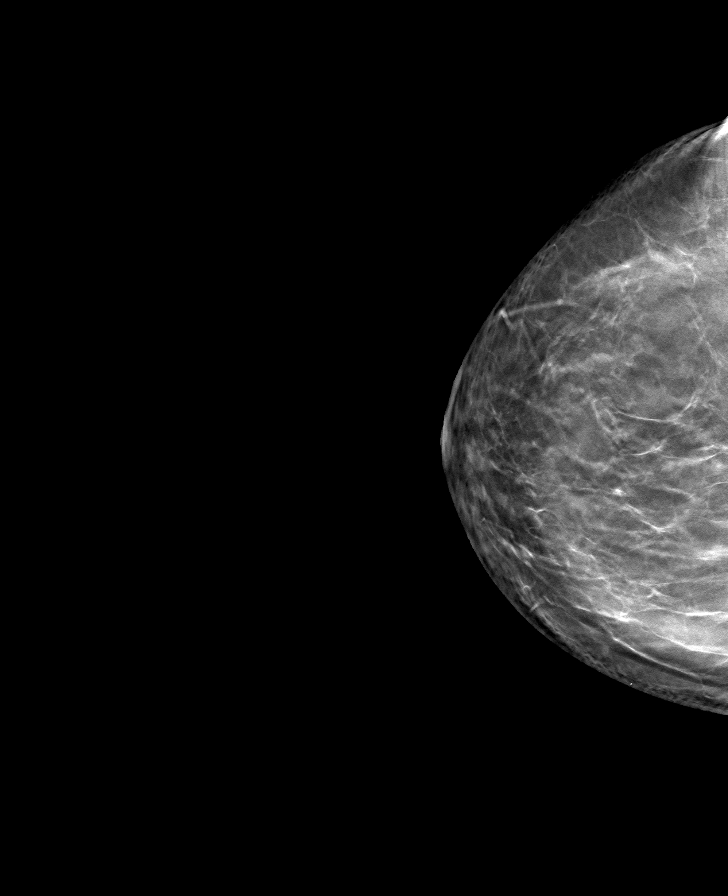

[R MLO tomo · tomo slice 45/90.0]
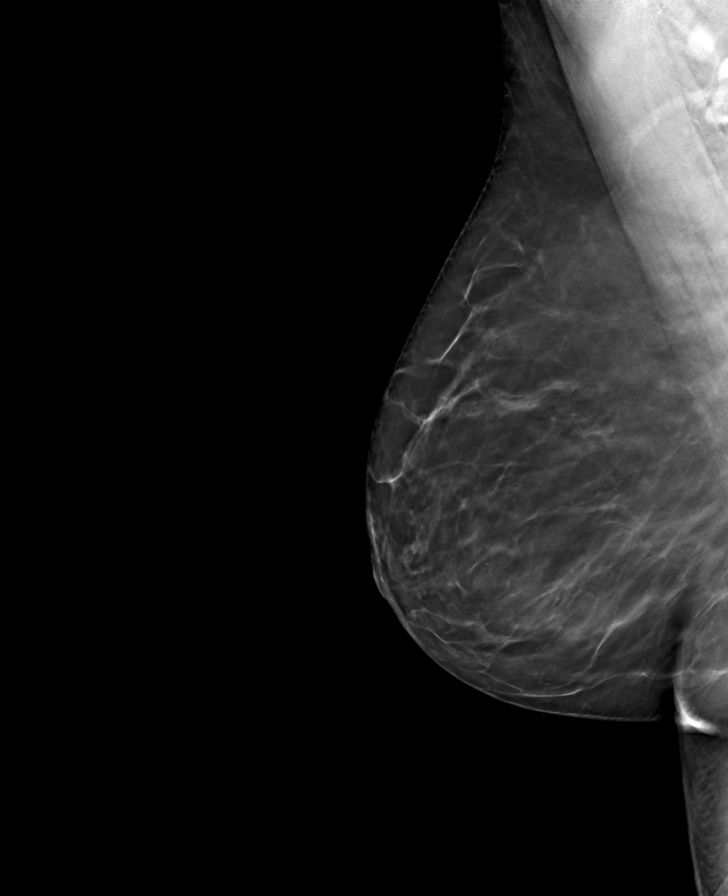

[L CC tomo · tomo slice 43/85.0]
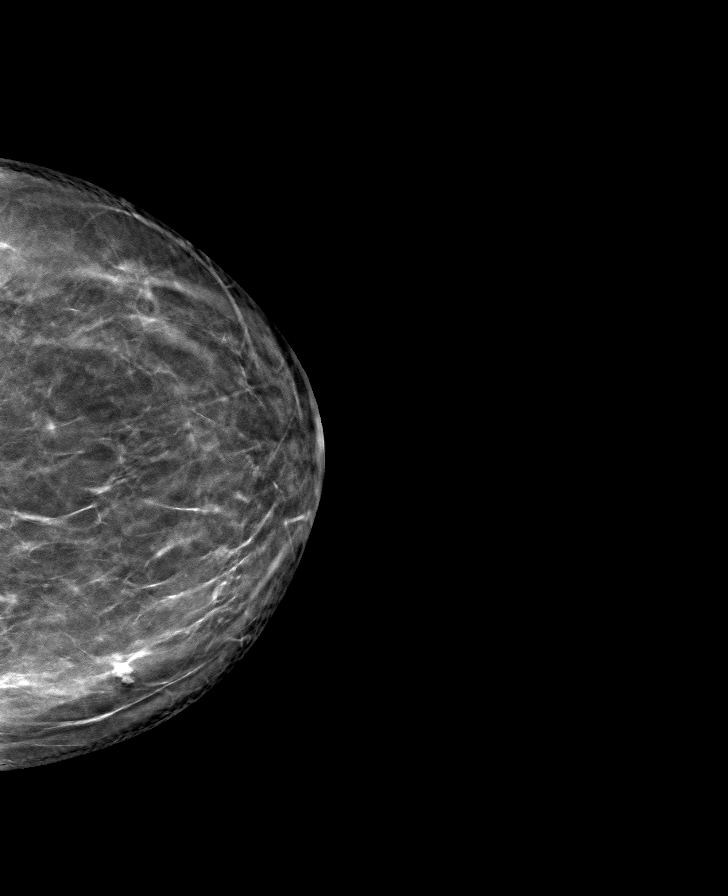

[L MLO tomo · tomo slice 45/90.0]
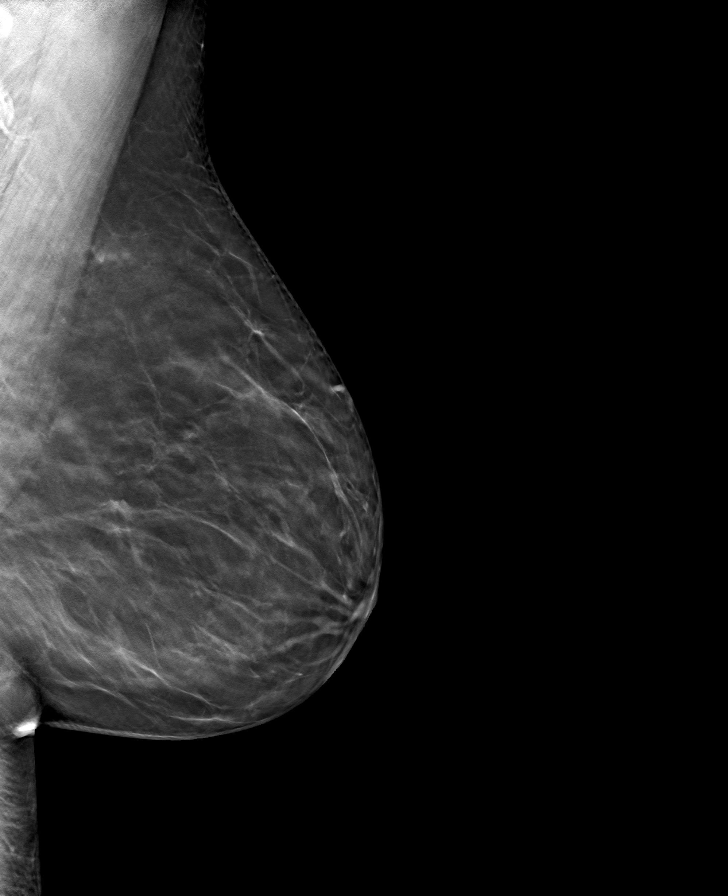

[8 of 24 positions shown; findings below may reference images not displayed]

ACR Breast Density Category b: There are scattered areas of
fibroglandular density.
FINDINGS: There are no findings suspicious for malignancy.
IMPRESSION: No mammographic evidence of malignancy. A result letter of this
screening mammogram will be mailed directly to the patient.

RECOMMENDATION:
Screening mammogram in one year. (Code:51-O-LD2)

BI-RADS CATEGORY  1: Negative.

## 2022-09-15 ENCOUNTER — Other Ambulatory Visit: Payer: Self-pay | Admitting: Obstetrics & Gynecology

## 2022-09-15 DIAGNOSIS — Z1231 Encounter for screening mammogram for malignant neoplasm of breast: Secondary | ICD-10-CM

## 2022-10-28 ENCOUNTER — Ambulatory Visit
Admission: RE | Admit: 2022-10-28 | Discharge: 2022-10-28 | Disposition: A | Discharge status: Home / Self Care | Payer: Medicare Other | Source: Ambulatory Visit | Attending: Family Medicine | Admitting: Family Medicine

## 2022-10-28 DIAGNOSIS — Z1231 Encounter for screening mammogram for malignant neoplasm of breast: Secondary | ICD-10-CM

## 2022-12-05 ENCOUNTER — Encounter: Payer: Self-pay | Admitting: Obstetrics and Gynecology

## 2022-12-05 ENCOUNTER — Ambulatory Visit (INDEPENDENT_AMBULATORY_CARE_PROVIDER_SITE_OTHER): Payer: Medicare Other | Admitting: Obstetrics and Gynecology

## 2022-12-05 VITALS — BP 140/98 | HR 85 | Ht 66.93 in | Wt 166.0 lb

## 2022-12-05 DIAGNOSIS — Z01419 Encounter for gynecological examination (general) (routine) without abnormal findings: Secondary | ICD-10-CM | POA: Insufficient documentation

## 2022-12-05 DIAGNOSIS — Z90710 Acquired absence of both cervix and uterus: Secondary | ICD-10-CM | POA: Insufficient documentation

## 2022-12-05 NOTE — Assessment & Plan Note (Signed)
Cervical cancer screening performed according to ASCCP guidelines. Encouraged annual mammogram screening Colonoscopy UTD DXA UTD Labs and immunizations with her primary Encouraged safe sexual practices as indicated Encouraged healthy lifestyle practices with diet and exercise For patients under 50-66yo, I recommend 1200mg  calcium daily and 600IU of vitamin D daily.

## 2022-12-05 NOTE — Patient Instructions (Signed)
For patients under 50-66yo, I recommend 1200mg  calcium daily and 600IU of vitamin D daily. For patients over 66yo, I recommend 1200mg  calcium daily and 800IU of vitamin D daily.  Health Maintenance, Female Adopting a healthy lifestyle and getting preventive care are important in promoting health and wellness. Ask your health care provider about: The right schedule for you to have regular tests and exams. Things you can do on your own to prevent diseases and keep yourself healthy. What should I know about diet, weight, and exercise? Eat a healthy diet  Eat a diet that includes plenty of vegetables, fruits, low-fat dairy products, and lean protein. Do not eat a lot of foods that are high in solid fats, added sugars, or sodium. Maintain a healthy weight Body mass index (BMI) is used to identify weight problems. It estimates body fat based on height and weight. Your health care provider can help determine your BMI and help you achieve or maintain a healthy weight. Get regular exercise Get regular exercise. This is one of the most important things you can do for your health. Most adults should: Exercise for at least 150 minutes each week. The exercise should increase your heart rate and make you sweat (moderate-intensity exercise). Do strengthening exercises at least twice a week. This is in addition to the moderate-intensity exercise. Spend less time sitting. Even light physical activity can be beneficial. Watch cholesterol and blood lipids Have your blood tested for lipids and cholesterol at 66 years of age, then have this test every 5 years. Have your cholesterol levels checked more often if: Your lipid or cholesterol levels are high. You are older than 66 years of age. You are at high risk for heart disease. What should I know about cancer screening? Depending on your health history and family history, you may need to have cancer screening at various ages. This may include screening  for: Breast cancer. Cervical cancer. Colorectal cancer. Skin cancer. Lung cancer. What should I know about heart disease, diabetes, and high blood pressure? Blood pressure and heart disease High blood pressure causes heart disease and increases the risk of stroke. This is more likely to develop in people who have high blood pressure readings or are overweight. Have your blood pressure checked: Every 3-5 years if you are 83-66 years of age. Every year if you are 4 years old or older. Diabetes Have regular diabetes screenings. This checks your fasting blood sugar level. Have the screening done: Once every three years after age 68 if you are at a normal weight and have a low risk for diabetes. More often and at a younger age if you are overweight or have a high risk for diabetes. What should I know about preventing infection? Hepatitis B If you have a higher risk for hepatitis B, you should be screened for this virus. Talk with your health care provider to find out if you are at risk for hepatitis B infection. Hepatitis C Testing is recommended for: Everyone born from [birth year] through 1964. Anyone with known risk factors for hepatitis C. Sexually transmitted infections (STIs) Get screened for STIs, including gonorrhea and chlamydia, if: You are sexually active and are younger than 66 years of age. You are older than 66 years of age and your health care provider tells you that you are at risk for this type of infection. Your sexual activity has changed since you were last screened, and you are at increased risk for chlamydia or gonorrhea. Ask your health care provider if  you are at risk. Ask your health care provider about whether you are at high risk for HIV. Your health care provider may recommend a prescription medicine to help prevent HIV infection. If you choose to take medicine to prevent HIV, you should first get tested for HIV. You should then be tested every 3 months for as long as you  are taking the medicine. Osteoporosis and menopause Osteoporosis is a disease in which the bones lose minerals and strength with aging. This can result in bone fractures. If you are 44 years old or older, or if you are at risk for osteoporosis and fractures, ask your health care provider if you should: Be screened for bone loss. Take a calcium or vitamin D supplement to lower your risk of fractures. Be given hormone replacement therapy (HRT) to treat symptoms of menopause. Follow these instructions at home: Alcohol use Do not drink alcohol if: Your health care provider tells you not to drink. You are pregnant, may be pregnant, or are planning to become pregnant. If you drink alcohol: Limit how much you have to: 0-1 drink a day. Know how much alcohol is in your drink. In the [place] one drink equals one 12 oz bottle of beer (355 mL), one 5 oz glass of wine (148 mL), or one 1 oz glass of hard liquor (44 mL). Lifestyle Do not use any products that contain nicotine or tobacco. These products include cigarettes, chewing tobacco, and vaping devices, such as e-cigarettes. If you need help quitting, ask your health care provider. Do not use street drugs. Do not share needles. Ask your health care provider for help if you need support or information about quitting drugs. General instructions Schedule regular health, dental, and eye exams. Stay current with your vaccines. Tell your health care provider if: You often feel depressed. You have ever been abused or do not feel safe at home. Summary Adopting a healthy lifestyle and getting preventive care are important in promoting health and wellness. Follow your health care provider's instructions about healthy diet, exercising, and getting tested or screened for diseases. Follow your health care provider's instructions on monitoring your cholesterol and blood pressure. This information is not intended to replace advice given to you by your health  care provider. Make sure you discuss any questions you have with your health care provider. Document Revised: 05/28/2020 Document Reviewed: 05/28/2020 Elsevier Patient Education  2024 [employer].

## 2022-12-05 NOTE — Progress Notes (Signed)
67 y.o. [id number] postmenopausal female s/p TAH/BSO (AUB), here for annual exam. Married. 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  Postmenopausal bleeding: none Pelvic discharge or pain: none Breast mass, nipple discharge or skin changes : none Last PAP: 2021 NIL Last mammogram: 10/28/22 BIRADS 1, density b Last colonoscopy: 12/18/14, q10y  Last DXA: 01/03/20 Normal, repeat 5 years  Sexually active: yes  Exercising: Gym/ health club routine includes cardio and light weights 3-4x/wk.  Smoker: no   GYN HISTORY: Prior TAH, BSO- benign  OB History  Gravida Para Term Preterm AB Living  2 2 0     2  SAB IAB Ectopic Multiple Live Births               # Outcome Date GA Lbr Len/2nd Weight Sex Type Anes PTL Lv  2 Para     F Vag-Spont     1 Para     F Vag-Spont       Past Medical History:  Diagnosis Date   Fibroid    NSVD (normal spontaneous vaginal delivery)    x2    Past Surgical History:  Procedure Laterality Date   ABDOMINAL HYSTERECTOMY  10/19/06   /BSO   TUBAL LIGATION  1989    Current Outpatient Medications on File Prior to Visit  Medication Sig Dispense Refill   Cholecalciferol (VITAMIN D3) 1000 UNITS CAPS Take 1,000 Units by mouth daily.     Multiple Vitamin (MULTI VITAMIN PO) Take by mouth.     No current facility-administered medications on file prior to visit.    Social History   Socioeconomic History   Marital status: Married    Spouse name: Not on file   Number of children: Not on file   Years of education: Not on file   Highest education level: Not on file  Occupational History   Not on file  Tobacco Use   Smoking status: Never   Smokeless tobacco: Never  Vaping Use   Vaping status: Never Used  Substance and Sexual Activity   Alcohol use: No   Drug use: No   Sexual activity: Yes    Partners: Male    Birth control/protection: Surgical    Comment: HYSTERECTOMY, older than 16, less than 5  Other Topics Concern   Not on file   Social History Narrative   Not on file   Social Determinants of Health   Financial Resource Strain: Not on file  Food Insecurity: Not on file  Transportation Needs: Not on file  Physical Activity: Not on file  Stress: Not on file  Social Connections: Not on file  Intimate Partner Violence: Not on file    Family History  Problem Relation Age of Onset   Other Mother        unknown health history, died when patient was young   Other Father        unknown health history, died when patient was young   Heart disease Neg Hx    Diabetes Neg Hx    Hypertension Neg Hx    Stroke Neg Hx     No Known Allergies    PE Today's Vitals   12/05/22 1007  BP: (!) 140/98  Pulse: 85  SpO2: 98%  Weight: 166 lb (75.3 kg)  Height: 5' 6.93" (1.7 m)   Body mass index is 26.05 kg/m.  Physical Exam Vitals reviewed. Exam conducted with a chaperone present.  Constitutional:      General: She is not in  acute distress.    Appearance: Normal appearance.  HENT:     Head: Normocephalic and atraumatic.     Nose: Nose normal.  Eyes:     Extraocular Movements: Extraocular movements intact.     Conjunctiva/sclera: Conjunctivae normal.  Neck:     Thyroid: No thyroid mass, thyromegaly or thyroid tenderness.  Pulmonary:     Effort: Pulmonary effort is normal.  Chest:     Chest wall: No mass or tenderness.  Breasts:    Right: Normal. No swelling, mass, nipple discharge or tenderness.     Left: Normal. No swelling, mass, nipple discharge or tenderness.  Abdominal:     General: There is no distension.     Palpations: Abdomen is soft.     Tenderness: There is no abdominal tenderness.  Genitourinary:    General: Normal vulva.     Exam position: Lithotomy position.     Urethra: No prolapse.     Vagina: Normal. No vaginal discharge or bleeding.     Cervix: No lesion.     Adnexa: Right adnexa normal and left adnexa normal.     Comments: Cervix and uterus absent Musculoskeletal:         General: Normal range of motion.     Cervical back: Normal range of motion.  Lymphadenopathy:     Upper Body:     Right upper body: No axillary adenopathy.     Left upper body: No axillary adenopathy.     Lower Body: No right inguinal adenopathy. No left inguinal adenopathy.  Skin:    General: Skin is warm and dry.  Neurological:     General: No focal deficit present.     Mental Status: She is alert.  Psychiatric:        Mood and Affect: Mood normal.        Behavior: Behavior normal.       Assessment and Plan:        Well woman exam with routine gynecological exam Assessment & Plan: Cervical cancer screening performed according to ASCCP guidelines. Encouraged annual mammogram screening Colonoscopy UTD DXA UTD Labs and immunizations with her primary Encouraged safe sexual practices as indicated Encouraged healthy lifestyle practices with diet and exercise For patients under 50-70yo, I recommend 1200mg  calcium daily and 600IU of vitamin D daily.      Rosalyn Gess, MD

## 2022-12-08 DIAGNOSIS — Z0000 Encounter for general adult medical examination without abnormal findings: Secondary | ICD-10-CM | POA: Diagnosis not present

## 2022-12-08 DIAGNOSIS — E782 Mixed hyperlipidemia: Secondary | ICD-10-CM | POA: Diagnosis not present

## 2022-12-08 DIAGNOSIS — R030 Elevated blood-pressure reading, without diagnosis of hypertension: Secondary | ICD-10-CM | POA: Diagnosis not present

## 2022-12-09 ENCOUNTER — Ambulatory Visit: Payer: Medicare HMO | Admitting: Obstetrics & Gynecology

## 2023-03-10 DIAGNOSIS — M549 Dorsalgia, unspecified: Secondary | ICD-10-CM | POA: Diagnosis not present

## 2023-03-10 DIAGNOSIS — M542 Cervicalgia: Secondary | ICD-10-CM | POA: Diagnosis not present

## 2023-03-17 DIAGNOSIS — M67911 Unspecified disorder of synovium and tendon, right shoulder: Secondary | ICD-10-CM | POA: Diagnosis not present

## 2023-03-17 DIAGNOSIS — M1711 Unilateral primary osteoarthritis, right knee: Secondary | ICD-10-CM | POA: Diagnosis not present

## 2023-04-07 DIAGNOSIS — M67911 Unspecified disorder of synovium and tendon, right shoulder: Secondary | ICD-10-CM | POA: Diagnosis not present

## 2023-04-13 DIAGNOSIS — M2011 Hallux valgus (acquired), right foot: Secondary | ICD-10-CM | POA: Diagnosis not present

## 2023-04-13 DIAGNOSIS — M2041 Other hammer toe(s) (acquired), right foot: Secondary | ICD-10-CM | POA: Diagnosis not present

## 2023-08-20 DIAGNOSIS — E782 Mixed hyperlipidemia: Secondary | ICD-10-CM | POA: Diagnosis not present

## 2023-09-14 ENCOUNTER — Other Ambulatory Visit: Payer: Self-pay | Admitting: Obstetrics & Gynecology

## 2023-09-14 DIAGNOSIS — Z1231 Encounter for screening mammogram for malignant neoplasm of breast: Secondary | ICD-10-CM

## 2023-09-20 DIAGNOSIS — E782 Mixed hyperlipidemia: Secondary | ICD-10-CM | POA: Diagnosis not present

## 2023-10-20 DIAGNOSIS — E782 Mixed hyperlipidemia: Secondary | ICD-10-CM | POA: Diagnosis not present

## 2023-10-29 ENCOUNTER — Ambulatory Visit
Admission: RE | Admit: 2023-10-29 | Discharge: 2023-10-29 | Disposition: A | Discharge status: Home / Self Care | Source: Ambulatory Visit | Attending: Family Medicine | Admitting: Family Medicine

## 2023-10-29 DIAGNOSIS — Z1231 Encounter for screening mammogram for malignant neoplasm of breast: Secondary | ICD-10-CM | POA: Diagnosis not present

## 2023-10-30 ENCOUNTER — Ambulatory Visit

## 2023-11-17 DIAGNOSIS — M1711 Unilateral primary osteoarthritis, right knee: Secondary | ICD-10-CM | POA: Diagnosis not present

## 2023-11-17 DIAGNOSIS — M25461 Effusion, right knee: Secondary | ICD-10-CM | POA: Diagnosis not present

## 2024-01-25 ENCOUNTER — Encounter: Payer: Self-pay | Admitting: Internal Medicine

## 2024-01-25 ENCOUNTER — Ambulatory Visit (INDEPENDENT_AMBULATORY_CARE_PROVIDER_SITE_OTHER): Admitting: Internal Medicine

## 2024-01-25 VITALS — BP 166/111 | HR 67 | Temp 98.1°F | Ht 67.0 in | Wt 166.5 lb

## 2024-01-25 DIAGNOSIS — R030 Elevated blood-pressure reading, without diagnosis of hypertension: Secondary | ICD-10-CM

## 2024-01-25 DIAGNOSIS — Z1211 Encounter for screening for malignant neoplasm of colon: Secondary | ICD-10-CM | POA: Diagnosis not present

## 2024-01-25 DIAGNOSIS — Z13820 Encounter for screening for osteoporosis: Secondary | ICD-10-CM | POA: Diagnosis not present

## 2024-01-25 NOTE — Progress Notes (Signed)
 "   New Patient Office Visit     CC/Reason for Visit: Establish care, discuss chronic conditions Previous PCP: Alberta Sharps Last Visit: Unknown  HPI: Rhyanna Sorce is a 69 y.o. female who is coming in today for the above mentioned reasons. Past Medical History is significant for: Elevated blood pressure throughout multiple instances in her lifetime, has never been told she is hypertensive.  She does not smoke or drink, no allergies, past surgical history significant for total abdominal hysterectomy with bilateral salpingo-oophorectomy.  No family history of significance.  She is due for pneumonia, COVID, shingles vaccinations.  She is due for bone density.   Past Medical/Surgical History: Past Medical History:  Diagnosis Date   Fibroid    NSVD (normal spontaneous vaginal delivery)    x2    Past Surgical History:  Procedure Laterality Date   ABDOMINAL HYSTERECTOMY  10/19/06   /BSO   TUBAL LIGATION  1989    Social History:  reports that she has never smoked. She has never used smokeless tobacco. She reports that she does not drink alcohol and does not use drugs.  Allergies: Allergies[1]  Family History:  Family History  Problem Relation Age of Onset   Other Mother        unknown health history, died when patient was young   Other Father        unknown health history, died when patient was young   Heart disease Neg Hx    Diabetes Neg Hx    Hypertension Neg Hx    Stroke Neg Hx     Current Medications[2]  Review of Systems:  Negative except as indicated in HPI.   Physical Exam: Vitals:   01/25/24 1338 01/25/24 1343  BP: (!) 152/102 (!) 166/111  Pulse: 67   Temp: 98.1 F (36.7 C)   TempSrc: Oral   SpO2: 99%   Weight: 166 lb 8 oz (75.5 kg)   Height: 5' 7 (1.702 m)    Body mass index is 26.08 kg/m.  Physical Exam Vitals reviewed.  Constitutional:      Appearance: Normal appearance.  HENT:     Head: Normocephalic and atraumatic.  Eyes:      Conjunctiva/sclera: Conjunctivae normal.  Cardiovascular:     Rate and Rhythm: Normal rate and regular rhythm.  Pulmonary:     Effort: Pulmonary effort is normal.     Breath sounds: Normal breath sounds.  Skin:    General: Skin is warm and dry.  Neurological:     General: No focal deficit present.     Mental Status: She is alert and oriented to person, place, and time.  Psychiatric:        Mood and Affect: Mood normal.        Behavior: Behavior normal.        Thought Content: Thought content normal.        Judgment: Judgment normal.       Impression and Plan:  Screening for malignant neoplasm of colon -     Ambulatory referral to Gastroenterology  Screening for osteoporosis -     DG Bone Density; Future  Elevated BP without diagnosis of hypertension   - Orders placed for DEXA and GI referral as she will be due for colonoscopy. - Blood pressure is again noted to be quite elevated today.  She is hesitant to start medication.  She will do ambulatory blood pressure measurements and return in 6 weeks for follow-up.  Time spent: 46 minutes reviewing chart,  interviewing and examining patient and formulating plan of care.       Tully Theophilus Andrews, MD [facility]     [1] No Known Allergies [2]  Current Outpatient Medications:    Cholecalciferol (VITAMIN D3) 1000 UNITS CAPS, Take 1,000 Units by mouth daily., Disp: , Rfl:    Multiple Vitamin (MULTI VITAMIN PO), Take by mouth., Disp: , Rfl:   "

## 2024-01-25 NOTE — Progress Notes (Deleted)
" ° ° ° °  Established Patient Office Visit     CC/Reason for Visit: ***  HPI: Courtney Baxter is a 69 y.o. female who is coming in today for the above mentioned reasons. Past Medical History is significant for: ***   Past Medical/Surgical History: Past Medical History:  Diagnosis Date   Fibroid    NSVD (normal spontaneous vaginal delivery)    x2    Past Surgical History:  Procedure Laterality Date   ABDOMINAL HYSTERECTOMY  10/19/06   /BSO   TUBAL LIGATION  1989    Social History:  reports that she has never smoked. She has never used smokeless tobacco. She reports that she does not drink alcohol and does not use drugs.  Allergies: Allergies[1]  Family History: *** Family History  Problem Relation Age of Onset   Other Mother        unknown health history, died when patient was young   Other Father        unknown health history, died when patient was young   Heart disease Neg Hx    Diabetes Neg Hx    Hypertension Neg Hx    Stroke Neg Hx     Current Medications[2]  Review of Systems:  Negative unless indicated in HPI.   Physical Exam: Vitals:   01/25/24 1338 01/25/24 1343  BP: (!) 152/102 (!) 166/111  Pulse: 67   Temp: 98.1 F (36.7 C)   TempSrc: Oral   SpO2: 99%   Weight: 166 lb 8 oz (75.5 kg)   Height: 5' 7 (1.702 m)     Body mass index is 26.08 kg/m.   Physical Exam   Impression and Plan:  Screening for malignant neoplasm of colon -     Ambulatory referral to Gastroenterology  Screening for osteoporosis -     DG Bone Density; Future  Elevated BP without diagnosis of hypertension     Time spent:*** minutes reviewing chart, interviewing and examining patient and formulating plan of care.     Courtney Theophilus Andrews, MD [facility]       [1] No Known Allergies [2]  Current Outpatient Medications:    Cholecalciferol (VITAMIN D3) 1000 UNITS CAPS, Take 1,000 Units by mouth daily., Disp: ,  Rfl:    Multiple Vitamin (MULTI VITAMIN PO), Take by mouth., Disp: , Rfl:  "

## 2024-02-05 ENCOUNTER — Ambulatory Visit (INDEPENDENT_AMBULATORY_CARE_PROVIDER_SITE_OTHER)
Admission: RE | Admit: 2024-02-05 | Discharge: 2024-02-05 | Disposition: A | Discharge status: Home / Self Care | Source: Ambulatory Visit | Attending: Internal Medicine | Admitting: Internal Medicine

## 2024-02-05 DIAGNOSIS — Z13820 Encounter for screening for osteoporosis: Secondary | ICD-10-CM | POA: Diagnosis not present

## 2024-02-11 ENCOUNTER — Ambulatory Visit: Payer: Self-pay | Admitting: Internal Medicine

## 2024-05-03 ENCOUNTER — Encounter: Admitting: Internal Medicine
